# Patient Record
Sex: Female | Born: 1945 | Race: White | Hispanic: No | Marital: Married
Health system: Midwestern US, Academic
[De-identification: ages and names within clinical notes are randomized; demographics above are authoritative.]

---

## 2003-05-16 NOTE — Unmapped (Signed)
Signed by Margaretmary Lombard MD on 05/16/2003 at 00:00:00  Internal Other/COST OF TREATMENT      Imported By: Peggyann Juba RMA 11/03/2003 15:53:48    _____________________________________________________________________    External Attachment:    Please see [employer] EMR for this document.

## 2003-05-16 NOTE — Unmapped (Signed)
Signed by Margaretmary Lombard MD on 05/16/2003 at 00:00:00  Office Visit      Imported By: Peggyann Juba RMA 11/03/2003 15:53:02    _____________________________________________________________________    External Attachment:    Please see [employer] EMR for this document.

## 2003-05-16 NOTE — Unmapped (Signed)
Signed by Baird Kay MD on 05/16/2003 at 00:00:00  Plastic Surgery      Imported By: Coletta Memos 03/19/2005 12:04:53    _____________________________________________________________________    External Attachment:    Please see [employer] EMR for this document.

## 2003-05-16 NOTE — Unmapped (Signed)
Signed by Baird Kay MD on 05/16/2003 at 00:00:00  Treatment Cost for Fraxel Laser      Imported By: Coletta Memos 03/19/2005 12:05:22    _____________________________________________________________________    External Attachment:    Please see [employer] EMR for this document.

## 2003-05-17 NOTE — Unmapped (Signed)
Signed by Margaretmary Lombard MD on 05/17/2003 at 00:00:00  Pathology Report      Imported By: Peggyann Juba RMA 11/03/2003 15:54:44    _____________________________________________________________________    External Attachment:    Please see [employer] EMR for this document.

## 2003-10-03 NOTE — Unmapped (Signed)
Signed by Avon Gully. Fricke on 10/08/2003 at 11:29:07    Phone Note   Call from Patient  Call back at cell [phone]  Caller: Patient  Call For: Mauri Reading  Reason for Call: Talk to Nurse  Summary of Call: Pt is interested in buying Glyquin xm.      Initial call taken by: Burnis Medin,  October 03, 2003 1:21 PM    New Medications:  GLYQUIN XM CREA (HYDROQUINONE-SUNSCREENS CREA) apply bid as directed

## 2003-10-22 NOTE — Unmapped (Signed)
Signed by Avon Gully. Fricke on 10/22/2003 at 15:48:01    Call back at [facility] Phone [phone]    Talked with Nazli's husband. Pharmacy will order Brand Name Glyquin and call whenit comes in.

## 2003-10-22 NOTE — Unmapped (Signed)
Signed by Avon Gully. Fricke on 10/22/2003 at 15:58:01    no visit today, call form completed.

## 2005-03-19 NOTE — Unmapped (Signed)
Signed by Albertine Patricia on 03/19/2005 at 11:51:27    Patient came in today requesting that the lesions be removed the day of her appt scheduled for 1/16. Verified that since is was so [place] ago since Dr. Jens Som saw her in 2005 we would need to have her evaluated again. We then figured out that she was given a quote for the V Beam laser so we reschedule dher to 1/30 with her understanding that she may not be a candidate for the laser again since it was so [place] that she was here. She understands this. She also has a couple of other brown spots that have come up since then as well and again no guarantee these can be removed with the V beam.

## 2006-06-16 NOTE — Unmapped (Signed)
Signed by Avon Gully. Fricke on 06/16/2006 at 19:15:53    Phone Note   Patient Call  Caller's Cell Phone #: [phone]  Caller: patient  Department: Surgery - Plastics  Call for: FRICKE     Summary of Call: NEW PATIENT TO THIS LOCATION - HASN'T BEEN SEEN IN A FEW YEARS --  WOULD FOR YOU TO DO THE TREATMENT YOU USE TO DO TO HER ON HER FIRST VISIT -- WANTED TO DISCUSS W/YOU BEFORE SCHEDULING AN APPT ...  PLEASE CALL CELL     Initial call taken by: Anders Simmonds,  June 16, 2006 9:46 AM              Left msg with with Michol.  I will call her back again on Friday.

## 2006-06-19 NOTE — Unmapped (Signed)
Signed by Avon Gully. Fricke on 06/19/2006 at 13:28:47    Phone Note   Outgoing Call    Summary of call: call Kyah's home and left msg.

## 2006-07-06 NOTE — Unmapped (Signed)
Signed by Avon Gully. Fricke on 07/06/2006 at 09:27:30    Printed Handout:  - Skin Care Home Treatment

## 2006-07-06 NOTE — Unmapped (Signed)
Signed by Baird Kay MD on 07/07/2006 at 13:44:40    Plastic Surgery Skin Care    Skin Evaluation   How do you want to improve your skin? tone, pigmentation  Do you have a history of chronic: sun exposure  Skin Classification: Environmentally Damaged/Hyperpigmented  [name] Sometimes Burn    Contraindications:   Pregnant/lactating: No   Recent facial surgery: No   History of keloid scarring: No   History of autoimmune disease: No   History of herpes simplex: Yes   Use of facial waxing or depilatories: No   Hisotry of Accutane use: No   Oral photosensitizing medications No   Facial warts: No   Recent radiation therapy: No   Allergies: Yes see medical chart  Philosophy of excessive sun exposure: Yes   Photosensitivity: No   Scrubs: Yes   Retinoid use: No   Smoking: No   Visia Complexion Analysis: Yes standard photos taken    Comments: Rebecca Ferguson is interested in having treatments to help reduce browns spots and overall aging.  She is also interested having an upper and lower belph.    Past Treatments:   Glycolic, Laser      Patient Goals for Improvement:   spots, wrinkles, sun damage, texture    Medications   AMITIZA  CAPS (LUBIPROSTONE CAPS)     Allergies  ! ERYTHROMYCIN BASE (ERYTHROMYCIN BASE TABS)      Skin Care Treatment     Area(s) Treated: face, neck  Skin Prepped with: Acetone  Eye Protection Used  Vitalize Peel: 2 layers  0.03% Retinoic Acid  Response: Tolerated well  Manufacturing systems engineer  Reviewed Post Care      Assessment     Plan   Light Acid Treatments #: 1 of 3  Correction  pt has lightening product at home she will use 2 times per day.  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      DISPOSITION:    Return to clinic in 3-4 week(s)         ]

## 2006-07-06 NOTE — Unmapped (Signed)
Signed by Baird Kay MD on 07/06/2006 at 00:00:00  Light Peel and Microdermabrasion      Imported By: Coletta Memos 07/08/2006 14:26:47    _____________________________________________________________________    External Attachment:    Please see [facility] EMR for this document.

## 2006-07-06 NOTE — Unmapped (Signed)
Signed by Avon Gully. Fricke on 07/06/2006 at 09:27:00    Printed Handout:  - Informed Consent for Light Peel and Microdermabrasion

## 2006-07-06 NOTE — Unmapped (Signed)
Signed by Avon Gully. Fricke on 07/06/2006 at 09:27:00    Printed Handout:  Sherlynn Stalls Peel/Microdermabrasion Instructions

## 2006-07-06 NOTE — Unmapped (Signed)
Signed by Dara Hoyer on 07/06/2006 at 14:00:58    [facility] Surgery Scheduling Form    Surgery  / Procedure Schedule Sheet   Requested Date: 07/30/2006  Requested Time: 11 30  Length of Surgery: 3hrs  Form Completed By: Dara Hoyer  Surgeon: Baird Kay MD  Facility: [facility]  Is patient: Out Pt.  Anesthesia Type: Local MAC/Anesthesia    Allergies:   Allergies have not been documented for this patient      Procedure   Procedure: bilateral upper and lower blepharoplasty  Diagnoses: cosmetic      Patient Information   Name: Rebecca Ferguson  DOB: 20-Jan-1946  SSN: [id number]  Address: [address]  [place], [place]  [address]  Gender: Female  Home phone: [phone]  IDX #: [id number]  Last Word #: [id number]  Primary Insurance: ANTHEM [place]  Member ID #: [id number]

## 2006-07-14 NOTE — Unmapped (Signed)
Signed by Dara Hoyer on 07/15/2006 at 18:00:56      Surgery Schedule Change Request Form     Division: Plastic  Surgeon: Baird Kay MD    Reason for Change     Cancel Surgery   Surgery  Date: 07/30/2006  Time: 11 30  Reason For Cancellation: PT CHANGED HER MIND    Notes: Patient Information   Name: Rebecca Ferguson  DOB: 10-29-1945  SSN: [id number]  Address: [address]  [place], [place]  [address]  Gender: Female  Home phone: [phone]  IDX #: [id number]      Completed By: Raynelle Fanning  Date: 07/14/2006

## 2006-07-16 NOTE — Unmapped (Signed)
Signed by Dara Hoyer on 07/16/2006 at 62:95:28    [facility] Surgery Scheduling Form    Surgery  / Procedure Schedule Sheet   Requested Date: 07/20/2006  Requested Time: 4 00  Length of Surgery: 30 MIN  Form Completed By: Dara Hoyer  Surgeon: Baird Kay MD  Facility: [facility] Procedure [facility]    Allergies:   ! ERYTHROMYCIN BASE (ERYTHROMYCIN BASE TABS)          Procedure   Procedure:   EXC LESION FACE  Diagnoses: LESION FACE      Patient Information   Name: Rebecca Ferguson  DOB: [birth year]  SSN: [id number]  Address: [address]  [place], [place]  [address]  Gender: Female  Home phone: [phone]  IDX #: [id number]  Last Word #: [phone]  Primary Insurance: ANTHEM [place]  Member ID #: [id number]

## 2006-07-20 NOTE — Unmapped (Signed)
Signed by Baird Kay MD on 07/20/2006 at 00:00:00    Plastic Photos       Imported By: Dara Hoyer 08/13/2006 13:39:21    _____________________________________________________________________    External Attachments:       1. Type: Image           Comment: External Document     2. Type: Image           Comment: External Document     3. Type: Image           Comment: External Document     4. Type: Image           Comment: External Document     5. Type: Image           Comment: External Document

## 2006-07-20 NOTE — Unmapped (Signed)
Signed by Baird Kay MD on 07/31/2006 at 12:31:13      Pre-Procedure Vital Signs     Medications   AMITIZA  CAPS (LUBIPROSTONE CAPS)     Allergies  ! ERYTHROMYCIN BASE (ERYTHROMYCIN BASE TABS)    Operative Report     Preoperative Diagnosis: lesion left cheek    Postoperative Diagnosis: same    Procedure: excision and simple closure lesion left cheek    Surgeon: Baird Kay MD  Anesthetic: Local 0.5% xylocaine with 1:200000 epinephrine with 4% sodium bicarbonate add in 4:1 ratio    Estimated Blood Loss: Nil    Complications: None apparent    Disposition: Home in good condition    Brief Medical History: lesion suspicious for subcutaneous ca  rec excision  Discussed with patient the risks, benefits and alternatives of the procedure, including scarring    After explanation of the risks, benefits and alternatives, the patient agreed to the procedure.    Details of Procedure:   The patient was taken to the [facility] operating room.  The area was carefully marked, infiltrated with local anesthetic and prepared with Betadine paint.  The lesions were elliptically excised and closed in layers with 6-0 Nylon deep subcutaneous layer The patient tolerated the procedure well and was given instructions for local care.  The patient will return to clinic in 1 week to check the path report and suture wound.        Plan    Orders for today's visit  1. Ordered 11440 - Excision Benign Lesion Face <0.5 cm [CPT-11440]

## 2006-07-20 NOTE — Unmapped (Signed)
Signed by Baird Kay MD on 07/20/2006 at 00:00:00  Operative Consent      Imported By: Heath Gold 07/26/2006 10:56:16    _____________________________________________________________________    External Attachment:    Please see [facility] EMR for this document.

## 2006-07-29 NOTE — Unmapped (Signed)
Signed by Baird Kay MD on 07/31/2006 at 12:31:43    Surgery Nurse Visit    Allergies  ! ERYTHROMYCIN BASE (ERYTHROMYCIN BASE TABS)    Medications   AMITIZA  CAPS (LUBIPROSTONE CAPS)       Intake recorded by: Trellis Moment. 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    Wound/Incision: Clean & dry.    Sutures/Staples: Removed.    Dressing: Other.       Details:  steris applied  Instructions Given: Other.       Details:  steris for 3 days, then moisturizer with an SPF, return PRN

## 2007-01-17 NOTE — Unmapped (Signed)
Signed by Baird Kay MD on 02/01/2007 at 13:46:46    Plastic Surgery Skin Care    Skin Evaluation   Reviewed previous skin evaluation- no changes needed.    Assessment/Comments: Rebecca Ferguson is here after the summer and is concerned with brown spots and the overall look of her skin.    Medications   AMITIZA  CAPS (LUBIPROSTONE CAPS)   HYDROQUINONE 4 % CREA (HYDROQUINONE) Apply every other night for two weeks then every night as tolerated.  TRETINOIN 0.05 % CREA (TRETINOIN) Apply a light application every other night for 2 weeks then every night as tolerated    Allergies  ! ERYTHROMYCIN BASE (ERYTHROMYCIN BASE TABS)      Skin Care Treatment     Area(s) Treated: face, neck  Skin Prepped with: Alcohol  Eye Protection Used     Glycolic Acid: 30%  Minutes: 2   Salicylic Acid: 30 to brown spots%  Minutes: 5  Dermaplaning, CO2    Response: Tolerated well  Manufacturing systems engineer  Reviewed Post Care    Skin Care AM Home Treatment Protocol:   cleanse  HQ   moisturizer   spf    Skin Care PM Home Treatment Protocol:   cleanser  tretinoin  HQ  moisturizer if needed      Plan   Light Acid Treatments #: 4-6 light acid peels every 2-4 weeks  Correction    Medications   New Prescriptions/Refills:  TRETINOIN 0.05 % CREA (TRETINOIN) Apply a light application every other night for 2 weeks then every night as tolerated  #20 gm tube x 0, 02/01/2007, W Thurnell Lose MD  HYDROQUINONE 4 % CREA (HYDROQUINONE) Apply every other night for two weeks then every night as tolerated.  #2 oz x 0, 02/01/2007, W Thurnell Lose MD      DISPOSITION:    Return to clinic in 2-4 week(s)         ]

## 2007-01-27 NOTE — Unmapped (Signed)
Signed by Baird Kay MD on 01/27/2007 at 00:00:00  Micro-Derm Abrasion Consent      Imported By: Heath Gold 02/01/2007 15:27:54    _____________________________________________________________________    External Attachment:    Please see [facility] EMR for this document.

## 2007-03-14 NOTE — Unmapped (Signed)
Signed by Avon Gully. Fricke on 03/14/2007 at 16:10:52    Phone Note   Outgoing Call    Summary of call: left msg on pt's cell to call me to discuss product and to see how pt's skin is doing.

## 2009-02-05 NOTE — Unmapped (Signed)
Signed by Generic  [facility] Provider on 02/05/2009 at 00:00:00  Light Peel and Microdermabrasion      Imported By: Coletta Memos 04/30/2009 11:07:52    _____________________________________________________________________    External Attachment:    Please see Gissela Bloch EMR for this document.

## 2009-02-05 NOTE — Unmapped (Signed)
Signed by Baird Kay MD on 02/05/2009 at 13:45:08    Plastic Surgery Skin Care    Skin Evaluation   How do you want to improve your skin? texture, tone, pigmentation  Do you have a history of chronic: sun exposure  Skin Classification: Environmentally Damaged/Hyperpigmented  [name] Sometimes Burn    Contraindications:   Pregnant/lactating: No   Recent facial surgery: No   History of keloid scarring: No   History of autoimmune disease: No   History of herpes simplex: Yes   Use of facial waxing or depilatories: Yes   Hisotry of Accutane use: No   Oral photosensitizing medications Yes   Facial warts: No   Recent radiation therapy: No   Philosophy of excessive sun exposure: Yes   Photosensitivity: No   Scrubs: Yes buff puff  Retinoid use: No   Smoking: No standard photos taken    Assessment/Comments:   pt would like to refresh her skin and reduce appearance of sundamage, brown spots, refine texture    Medications   AMITIZA  CAPS (LUBIPROSTONE CAPS)   * CALCIUM   * MULTI-VITAMIN   * BABY ASPIRIN   * WATER PILL   * BIO-IDENTICAL HORMONES   REFISSA 0.05 % CREA (TRETINOIN (FACIAL WRINKLES)) apply in the evening every other night for 2 weeks then every night as directed  HYDROQUINONE 4 % CREA (HYDROQUINONE) bid    Allergies  ! ERYTHROMYCIN BASE (ERYTHROMYCIN BASE TABS)  Allergy and adverse reaction list reviewed during this update.    PAST HISTORY  Past Medical History (reviewed - no changes required):  Hormone Replacement Therapy, bio identical hormones  Surgical History (reviewed - no changes required):  Hysterectomy: 1-10, eyelid surgery 2-09        Skin Care Treatment     Area(s) Treated: face, neck  Skin Prepped with: Alcohol  Eye Protection Used  Treatment #: 1  of: 3  Treatment #: 1  of: 3   Vitalize Peel: 2 layers  Dermaplaning    Response: Tolerated well  Manufacturing systems engineer  Reviewed Post Care    Skin Care AM Home Treatment Protocol:   obagi  cleansing  cream  toner  clear  exfoderm  sunfader    Skin Care PM Home Treatment Protocol:   obagi  cleansing cream  toner  clear  blender  refissa      Plan   Light Acid Treatments #: series of 3 light peels every 4 weeks  Correction  pt will start obagi products and return in 4-6 weeks.   pt states she will decide if she wants to continue with light peels.    Medications   New Prescriptions/Refills:  HYDROQUINONE 4 % CREA (HYDROQUINONE) bid  #2 oz x 0, 02/05/2009, Avon Gully. Fricke  REFISSA 0.05 % CREA (TRETINOIN (FACIAL WRINKLES)) apply in the evening every other night for 2 weeks then every night as directed  #20gm x 0, 02/05/2009, Avon Gully. Mauri Reading      Today's Products/Services   Gentle Cleanser [COSMETIC]  Toner  [COSMETIC]  Clear RX 4% HQ [COSMETIC]  Exfoderm [COSMETIC]  Blender RX 4% HQ [COSMETIC]  Sunfader RX 4% HQ [COSMETIC]  Starter Kit Normal/Dry w/4% HQ [COSMETIC]  Other Office Service or Procedure [CPT-99999]    DISPOSITION:    Return to clinic in 4-6 week(s)

## 2009-02-05 NOTE — Unmapped (Signed)
Signed by Baird Kay MD on 02/05/2009 at 00:00:00    Plastic Photos      Imported By: Kandace Parkins 02/26/2010 11:03:58    _____________________________________________________________________    External Attachments:       1. Type: Image           Comment: External Document     2. Type: Image           Comment: External Document     3. Type: Image           Comment: External Document     4. Type: Image           Comment: External Document

## 2009-02-05 NOTE — Unmapped (Signed)
Signed by Avon Gully. Fricke on 02/05/2009 at 10:04:29    Printed Handout:  - CONSENT - LIGHT PEELS AND MICRODERMABRASION

## 2009-02-05 NOTE — Unmapped (Signed)
Signed by Avon Gully. Fricke on 02/05/2009 at 10:04:59    Printed Handout:  - POST-TREATMENT INSTRUCTIONS: LIGHT PEEL/MICRODERMABRASION

## 2011-01-14 ENCOUNTER — Ambulatory Visit: Admit: 2011-01-14 | Discharge: 2011-01-14

## 2011-01-14 DIAGNOSIS — Z411 Encounter for cosmetic surgery: Secondary | ICD-10-CM

## 2011-01-14 NOTE — Unmapped (Signed)
Skin Evaluation     How do you want to improve your skin? texture, tone, pigmentation, brown spot   Do you have a history of chronic: sun exposure   Skin Classification: Environmentally Damaged/Hyperpigmented   [name]      Contraindications:   Pregnant/lactating: No   Recent facial surgery: No   History of keloid scarring: No   History of autoimmune disease: No   History of herpes simplex: Yes usually caused by sun  Use of facial waxing or depilatories: Yes, every 3-4 weeks   Hisotry of Accutane use: No   Oral photosensitizing medications Yes   Facial warts: No   Recent radiation therapy: No   Philosophy of excessive sun exposure: Yes   Photosensitivity: No   Scrubs: Yes buff puff   Retinoid use: No   Smoking: no     Comments:  Last seen for skin care on 02/05/09  Presently using otc products and sunscreen spf 50 daily   Patient would like to refresh her skin and reduce appearance of sundamage, brown spots, refine texture.  She is interested in starting a skin care program again and would like information and pricing.  She would like to look over the information from today's visit and discuss it with her husband.  She will return for a physician skin care consultation if she decides to start the program.  Reviewed the skin care program, light acid peels and the Obagi system.      Photos:01/14/11

## 2011-01-17 NOTE — Unmapped (Signed)
Reviewed light acid peel / microdermabrasion instructions, pre and post  treatment instructions given to patient.

## 2011-02-11 ENCOUNTER — Ambulatory Visit: Admit: 2011-02-11 | Discharge: 2011-02-11

## 2011-02-11 DIAGNOSIS — Z411 Encounter for cosmetic surgery: Secondary | ICD-10-CM

## 2011-02-11 NOTE — Unmapped (Signed)
Physician Skin Care Visit    Skin Evaluation     How do you want to improve your skin? texture, tone, pigmentation, brown spot   Do you have a history of chronic: sun exposure   Skin Classification: Environmentally Damaged/Hyperpigmented   [name]      Contraindications:   Pregnant/lactating: No   Recent facial surgery: No   History of keloid scarring: No   History of autoimmune disease: No   History of herpes simplex: Yes, usually caused by sun  Use of facial waxing or depilatories: Yes, every 3-4 weeks   Hisotry of Accutane use: No   Oral photosensitizing medications Yes   Facial warts: No   Recent radiation therapy: No   Philosophy of excessive sun exposure: Yes   Photosensitivity: No   Scrubs: Yes, buff puff   Retinoid use: No   Smoking: no     Comments:  Last seen for skin care on 02/05/09  Presently using otc products and sunscreen spf 50 daily   Interested in starting the [employer] system.  She would like  to refresh her skin and reduce appearance of sundamage, brown spots, fine lines and uneven  texture.    Reviewed the skin care program, light acid peels and the Obagi system.      Physician Visit: 02/11/11 with Dr. Cyndie Chime  Plan: Start the [facility] System, tretinoin and return in 4-6 weeks.  Series of  3 light acid peels every 4 weeks.  Reviewed light acid peels, the Obagi system tretinoin, hydroquinone and the daily use of sunscreen. Stop using the buff puff.    Photos:01/14/11

## 2011-02-11 NOTE — Unmapped (Signed)
Reviewed home treatment protocol.     AM Morning Home Protocol  Obagi   Gentle Cleanser   Toner  Clear  Exfoderm  Sunfader    PM Evening Home Protocol  Obagi   Gentle Cleanser   Toner  Clear  Blender  Tretinoin  -every other for 2 weeks then every night as tolerated       Purchased Products: Obagi Nuderm System; Gentle Cleanser, Toner, Clear, Exfoderm, Blender, Sunfader.  Tretinoin 0.1%

## 2011-02-17 MED ORDER — hydroquinone (MELQUIN-HP) 4 % cream
4 | Freq: Two times a day (BID) | TOPICAL | 0.00 refills | 29.00000 days | Status: AC
Start: 2011-02-17 — End: 2017-02-22

## 2011-02-17 MED ORDER — tretinoin (RETIN-A) 0.1 % cream
0.1 | TOPICAL | Status: AC
Start: 2011-02-17 — End: 2017-02-22

## 2011-02-17 NOTE — Unmapped (Signed)
I have reviewed the notes, assessments, and/or procedures performed by [facility] with the patient and  I concur with her documentation of Rebecca Ferguson.

## 2011-04-13 ENCOUNTER — Institutional Professional Consult (permissible substitution): Admit: 2011-04-13 | Discharge: 2011-04-13

## 2011-04-13 DIAGNOSIS — Z411 Encounter for cosmetic surgery: Secondary | ICD-10-CM

## 2011-04-13 NOTE — Unmapped (Signed)
Comments:  Patient is using Obagi Nuderm system including tretinoin 3 x per week.  No peeling or dryness. Redness on cheeks and nose.  Stopped tretinoin 1 week ago but used a buff puff on her skin 2 days ago.  No treatment today.  Suggested a physician consultation  to discuss options for treating telangiectasia.  Reviewed pre treatment instructions for light peels including stopping all exfoliants 5-7 days  prior to treatment including scrubs and buff puffs.

## 2011-04-13 NOTE — Unmapped (Addendum)
Reviewed home treatment protocol.     AM Morning Home Protocol  Obagi   Gentle Cleanser   Toner  Clear  Exfoderm  Sunfader    PM Evening Home Protocol  Obagi   Gentle Cleanser   Toner  Clear  Blender  Tretinoin  -every other night, cheeks and nose 2-3 x per week

## 2011-05-15 NOTE — Unmapped (Signed)
Patient called and would like to know which products to use on vacation and when to have a light peel.  Reviewed protocol.  AM Gentle Cleanser, Toner, Moisturizer and Sunscreen spf 30 or higher. Reapply sunscreen every 2 hours when outdoors.  PM Gentle Cleanser, Toner, Blender, Moisturizer.  Stop tretinoin during vacation and 1 week prior to vacation.  Patient states she has a Neutrogena moisturizer/sunscreen spf50.    Patient will wait until she is back for her trip to schedule a light peel.  Schedule at least 2 weeks after returning from vacation and stop tretinoin 1 week prior to treatment.

## 2011-08-07 ENCOUNTER — Institutional Professional Consult (permissible substitution): Admit: 2011-08-07 | Discharge: 2011-08-07

## 2011-08-07 DIAGNOSIS — Z411 Encounter for cosmetic surgery: Secondary | ICD-10-CM

## 2011-08-07 NOTE — Unmapped (Signed)
LIght Acid Peel       Treatment # 1 of  6    Area Treated:  Face,neck    Cleansed   Prep with Alcohol   Dermaplaning   Eye Protection  Applied 30% Glycolic Acid  for 3 minutes  Applied 20 %  Salicylic to three brown spots on cheeks  Applied Moisturizer  Applied Sunscreen    Comments:      Physician Visit: 02/11/11 with Dr. Cyndie Chime   Plan: Start the [facility] System, tretinoin and return in 4-6 weeks.   Series of 3 light acid peels every 4 weeks.   Reviewed light acid peels, the Obagi system tretinoin, hydroquinone and the daily use of sunscreen. Stop using the buff puff.      Consent Signed: 08/07/11  Photos: 08/07/11

## 2011-08-07 NOTE — Unmapped (Addendum)
Reviewed home treatment protocol.     AM Morning Home Protocol  Obagi   Gentle Cleanser   Toner  Clear  Exfoderm  Sunfader      Suggested Vit C Serum 15%    PM Evening Home Protocol  Obagi   Gentle Cleanser   Toner  Clear  Blender  Tretinoin  -every other night, cheeks and nose 2-3 x per week

## 2017-02-22 ENCOUNTER — Ambulatory Visit: Admit: 2017-02-22 | Discharge: 2017-02-22 | Payer: MEDICARE

## 2017-02-22 DIAGNOSIS — M65332 Trigger finger, left middle finger: Secondary | ICD-10-CM

## 2017-02-22 NOTE — Unmapped (Signed)
This office note has been dictated.    This note has been dictated.    I saw and evaluated the patient, and discussed with the resident. I agree with the resident  findings and plan as documented in the office note.    [place] Surges J Eppie Barhorst, M.D.

## 2017-02-22 NOTE — Unmapped (Signed)
Rebecca Ferguson  02/22/2017     CHIEF COMPLAINT     Chief Complaint   Patient presents with   ??? New Patient Visit/ Consultation     left 3rd digit        HISTORY OF PRESENT ILLNESS     Rebecca Ferguson is a right hand dominant 71 y.o. female who presents with triggering of the left middle finger.  Pain is also present to the volar base of the finger in the region of the A1 pulley.  Pressure to this area exacerbates the patient's symptoms.  This has been present for 6 months and has been following a daily course during that time.  Associated symptoms include finger swelling.  No previous treatments.    Likes to work out, [employer], and walk.  Cleans house  Spinal fusion 3 weeks ago  Wakes up to stiff finger  Her mother had surgery for trigger finger  No DM, RA, or thyroid problems     MEDICAL HISTORY   Allergies:  Allergies   Allergen Reactions   ??? Erythromycin Base        Past Medical History:  Past Medical History:   Diagnosis Date   ??? Health education/counseling     Hormone replacement therapy- Bio-Identical hormones       Current Medications:  @ACTMED @    Past Surgical History:   Past Surgical History:   Procedure Laterality Date   ??? EYELID SURGERY  04/2007   ??? HYSTERECTOMY  03/2008       Social History:   Social History     Social History   ??? Marital status: Married     Spouse name: N/A   ??? Number of children: N/A   ??? Years of education: N/A     Social History Main Topics   ??? Smoking status: Former Smoker   ??? Smokeless tobacco: Not on file   ??? Alcohol use 4.2 oz/week     7 Glasses of wine per week   ??? Drug use: Unknown   ??? Sexual activity: Not on file     Other Topics Concern   ??? Caffeine Use No   ??? Exercise Yes     Social History Narrative   ??? No narrative on file       Family History:  Family History   Problem Relation Age of Onset   ??? Cancer Mother         melanoma        REVIEW OF SYSTEMS     Constitutional:  Denies fever, chills or weight loss   Eyes:  Denies photophobia or discharge   HEENT:  Denies sore throat  or acute eye problems   Respiratory:  Denies cough or shortness of breath   Cardiovascular:  Denies chest pain or palpitations  GI:  Denies abdominal pain, nausea, vomiting, or diarrhea   Skin:  Denies rash   Neurologic:  Denies headache, focal weakness or sensory changes   Endocrine:  Denies polyuria or polydypsia   Lymphatic:  Denies swollen glands   Musculoskeletal:  As described in HPI     PHYSICAL EXAMINATION     General: Well-nourished, no acute distress, normal affect, conversant and oriented    Upper Extremity Overview:   Normal alignment of the elbows, wrists and digits.  No visible or palpable tenosynovitis of the extensor tendons, the wrist or the digital articulations.    Skin:  Normal appearance and texture, consistent with age.  No significant  lesions or abnormalities.    Vascular: All fingertips are pink with good capillary refill and of normal temperature.  No edema or atrophy.  No areas of ischemia or ulcers.    Range of Motion:  Elbows: Normal carrying angle and a full arc of motion into flexion and extension, supination and pronation.  Wrists: Full and symmetric arc of motion.    All fingers demonstrated a full, free and uninhibited range of motion    Palpatory:  There was focal tenderness over the A1 pulley of the left middle finger.  A palpable nodule was felt within the flexor tendons in the region of the A1 pulley as they patient was brought through flexion and extension.  Pressure over this region with movement exacerbated symptoms.  No additional areas of pain or discomfort identified    Sensory Exam:  Light touch was intact in both forearms and hands.     Sensory Provocative Tests:    Phalen's and Tinel's were negative over the median nerves at the wrist and the ulnar nerves at the elbow.    Motor Exam:  There was no extrinsic or intrinsic muscular atrophy.  All musculoskeletal units were functioning independently at a 5/5 strength.    Joint Stability: No obvious dislocation, subluxation or  significant laxity in either upper extremity.    Other Provocative Tests and Abnormal Findings:  None      IMAGING     none     IMPRESSIONS   Rebecca Ferguson is a 71 y.o. female who presents with the following diagnoses:  1. Left Stucki finger trigger finger    Interventions:  1. None today    PLAN     We discussed the causes, anatomy and different treatment options for trigger finger.  Patient education materials have been reviewed.  We have discussed the options of monitoring, cortisone injection and surgical release along with associated risks and benefits. Based on our lengthy discussion the patient has elected to proceed with monitoring.  We will see her back as needed for reevaluation.  All questions were answered in the office today.      Harrell Niehoff Crist Infante, MD

## 2019-12-25 IMAGING — MG MAMMOGRAPHY SCREENING BILATERAL 3D TOMOSYNTHESIS WITH CAD
8 series · 8 of 24 positions shown · non-contrast
Comparison: Comparison was made to prior exams. 
BREAST DENSITY: (Level B) There are scattered areas of fibroglandular density.

MAMMOGRAPHY SCREENING BILATERAL 3D TOMOSYNTHESIS WITH CAD, 12/25/2019 [DATE]: 
CLINICAL INDICATION: Screening exam.
TECHNIQUE: Digital bilateral mammograms and 3-D Tomosynthesis were obtained. 
These were interpreted both primarily and with the aid of computer-aided 
detection system.

[R MLO]
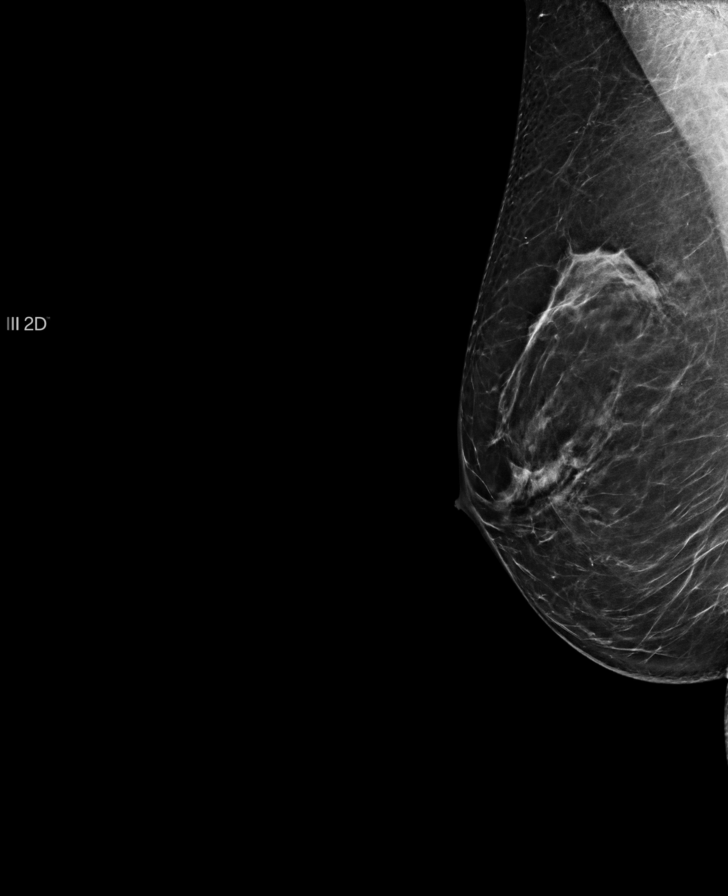

[R CC]
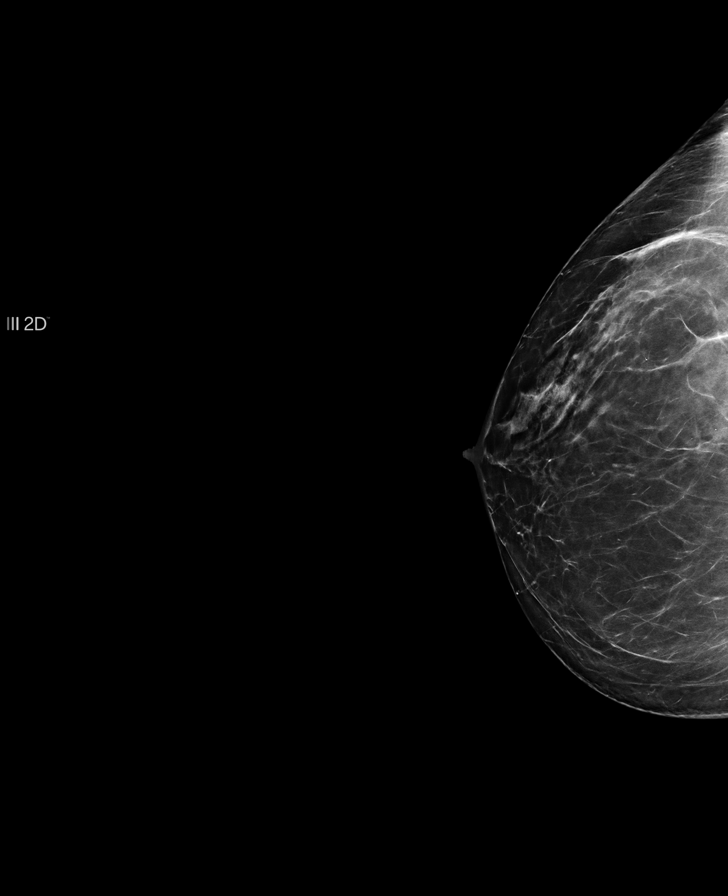

[L MLO]
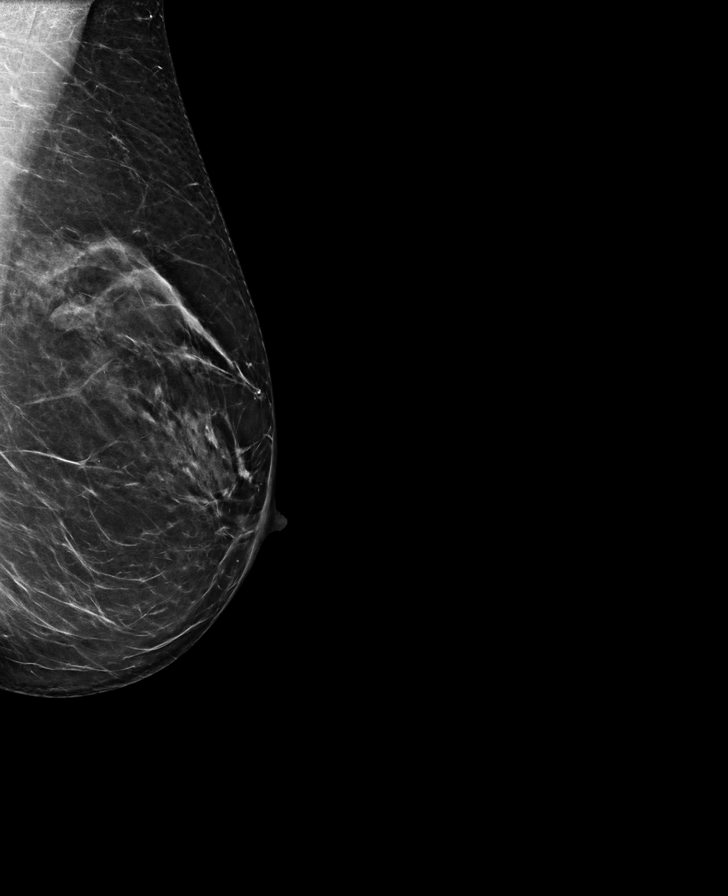

[L CC]
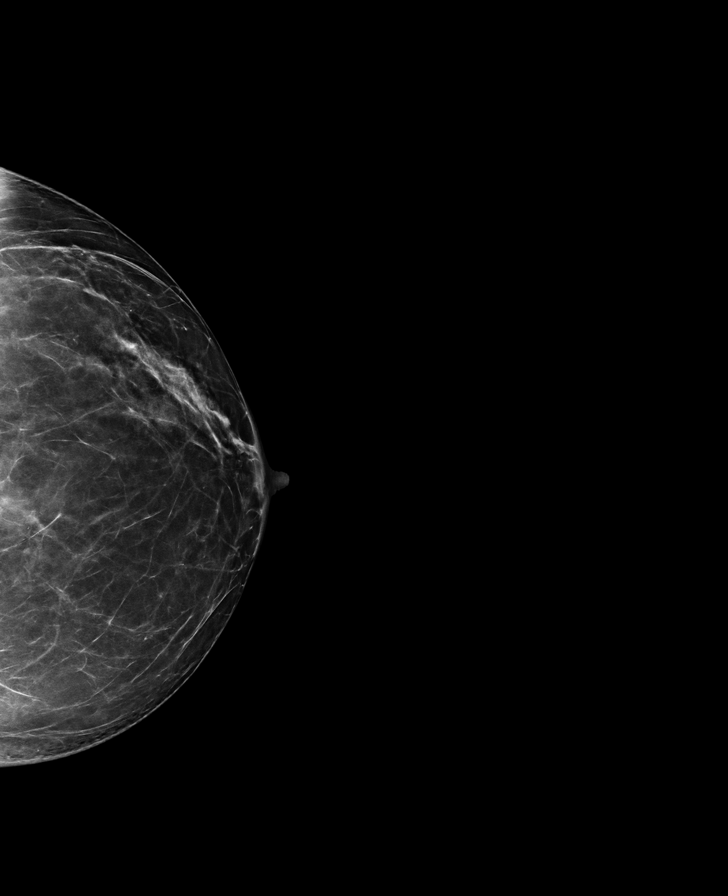

[R MLO tomo · tomo slice 37/74.0]
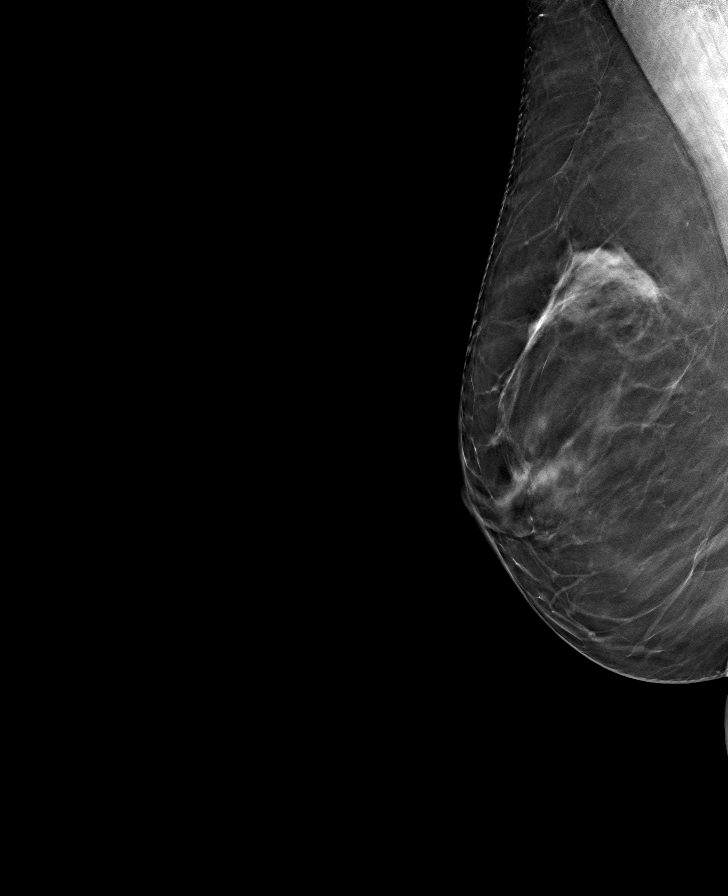

[L CC tomo · tomo slice 35/68.0]
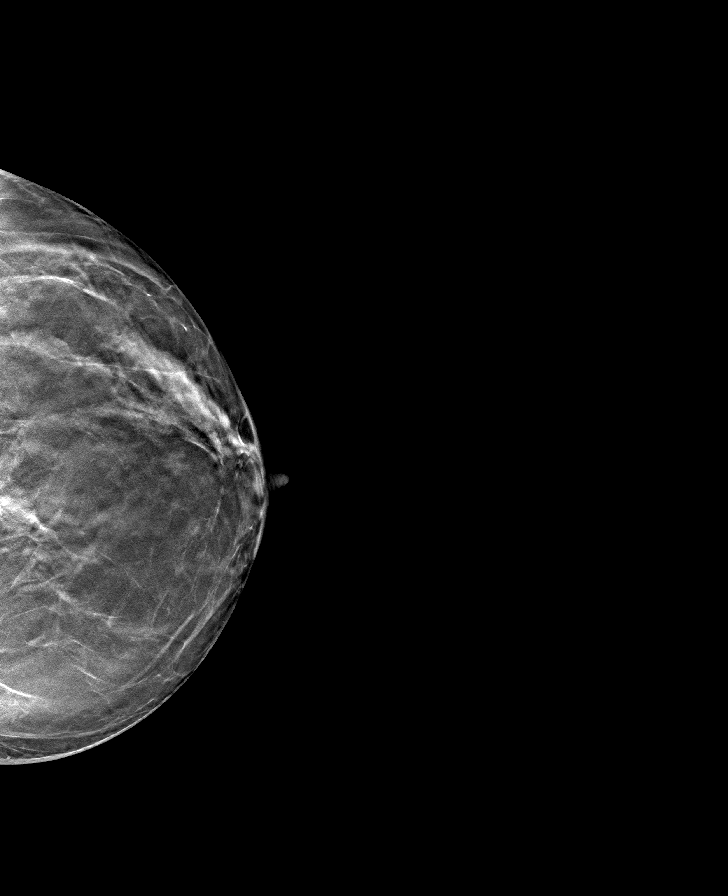

[L MLO tomo · tomo slice 37/73.0]
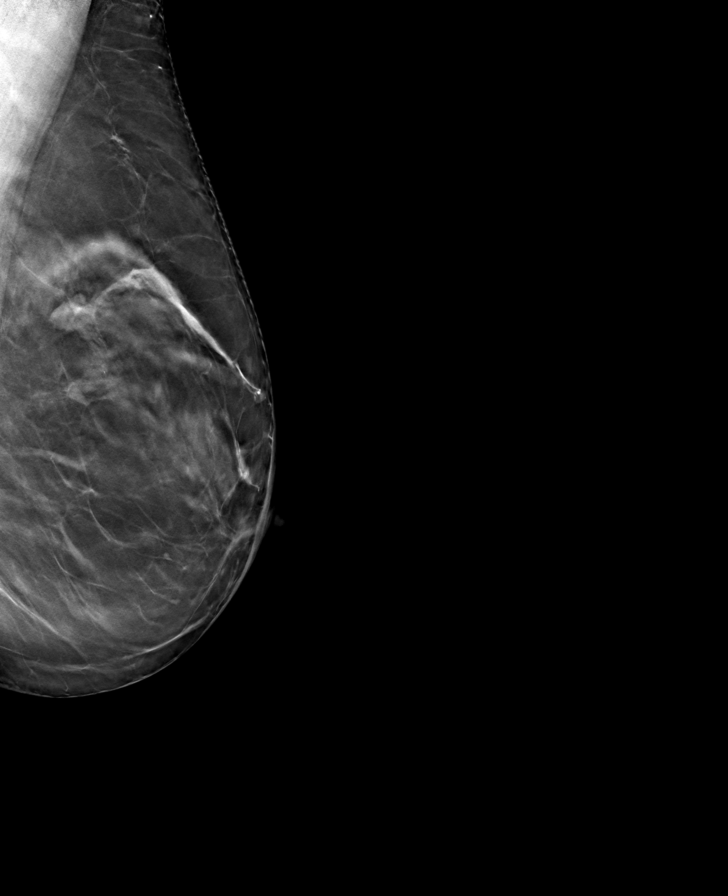

[R CC tomo · tomo slice 37/73.0]
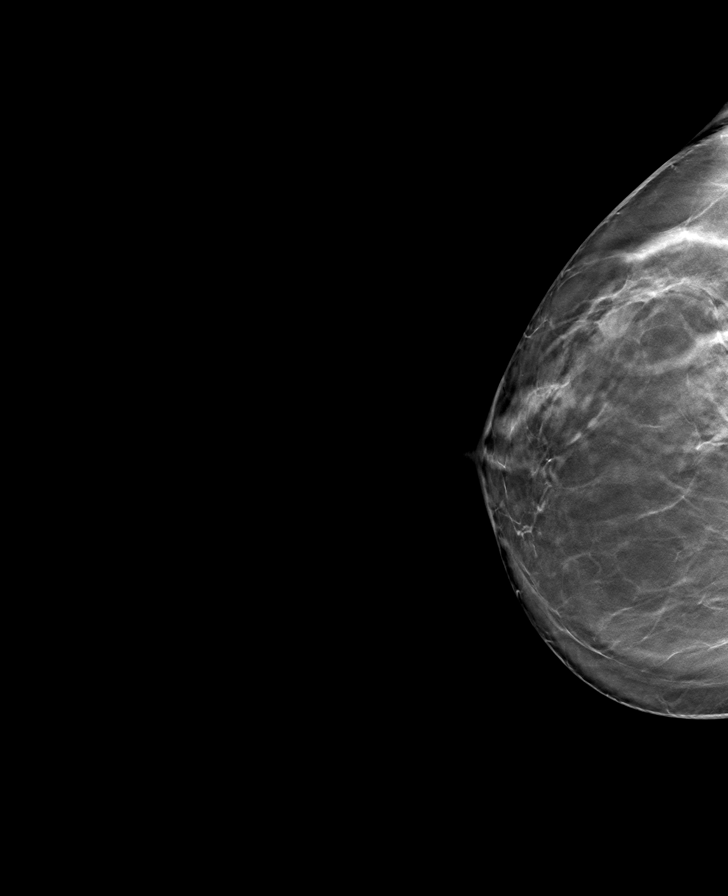

[8 of 24 positions shown; findings below may reference images not displayed]

FINDINGS: No suspicious mass, calcifications, or area of architectural 
distortion in either breast.
IMPRESSION: No mammographic evidence of malignancy in either breast. 
( BI-RADS 1) Negative mammogram. Routine mammographic follow-up is recommended.

## 2021-01-03 IMAGING — MG MAMMOGRAPHY SCREENING BILATERAL 3[PERSON_NAME]
8 series · 8 of 24 positions shown · non-contrast
Comparison: 12/26/2019 and dating back to 11/07/2015.

________________________________________________________________________________________________ 
MAMMOGRAPHY SCREENING BILATERAL 3BLAIN JUMPER, 01/03/2021 [DATE]: 
CLINICAL INDICATION: Screening.
TECHNIQUE: Digital bilateral mammograms and 3-D Tomosynthesis were obtained. 
These were interpreted both primarily and with the aid of computer-aided 
detection system.  
BREAST DENSITY: (Level B) There are scattered areas of fibroglandular density.

[L CC]
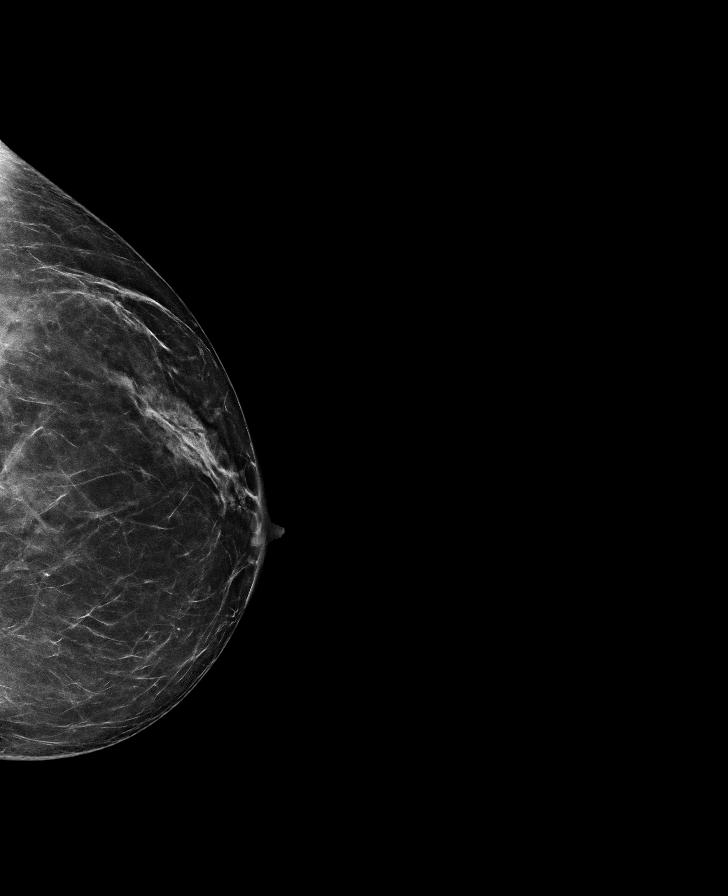

[R CC]
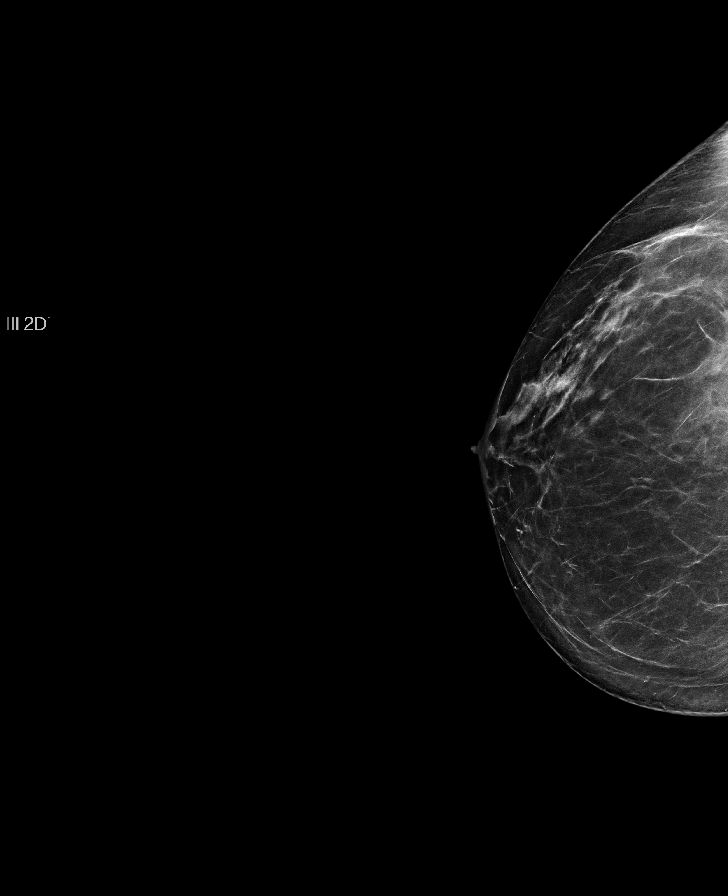

[R MLO]
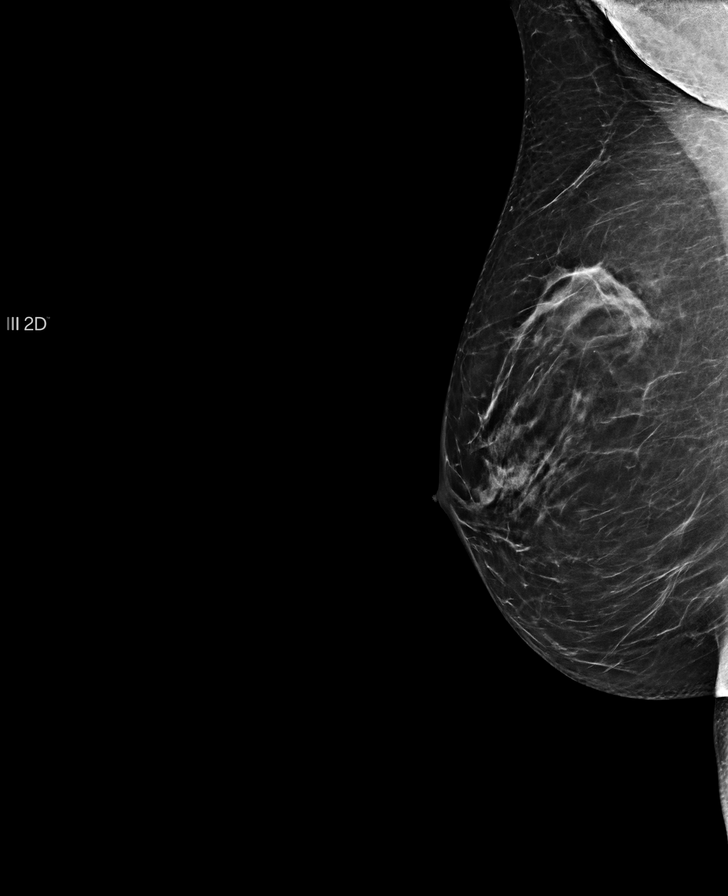

[L MLO]
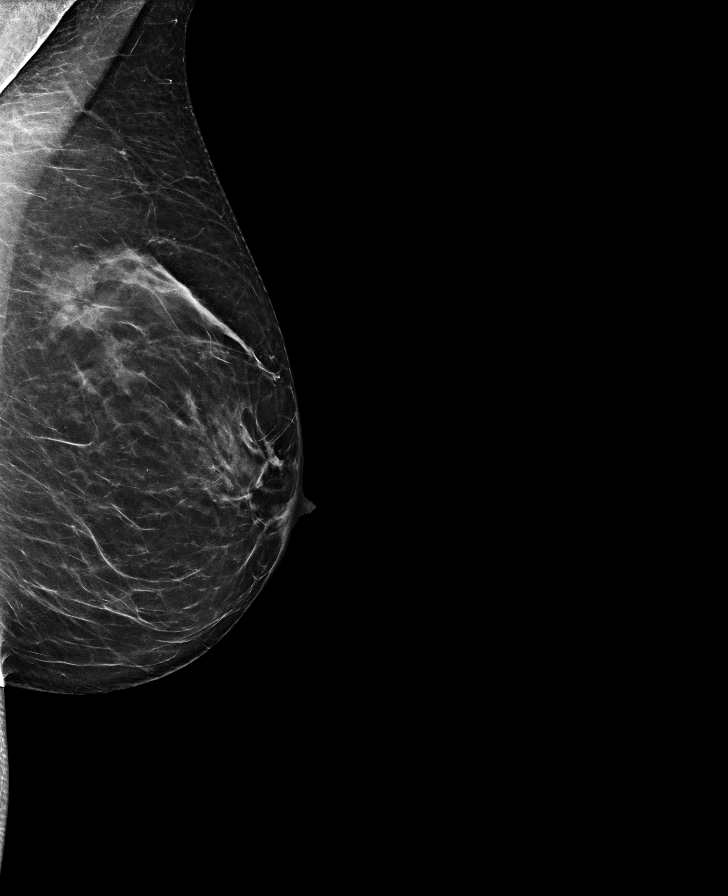

[L CC tomo · tomo slice 35/69.0]
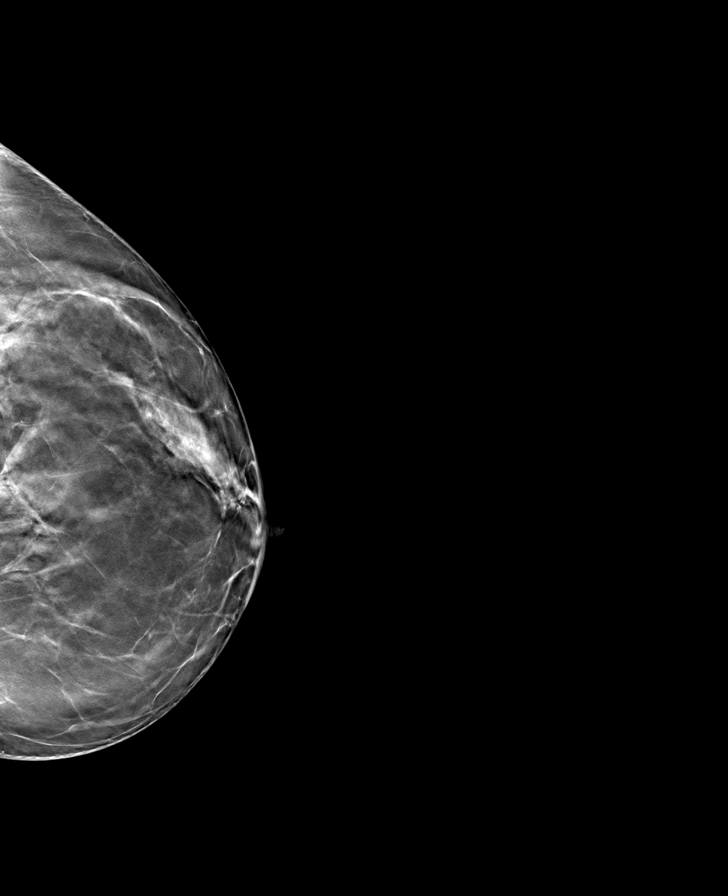

[R CC tomo · tomo slice 34/67.0]
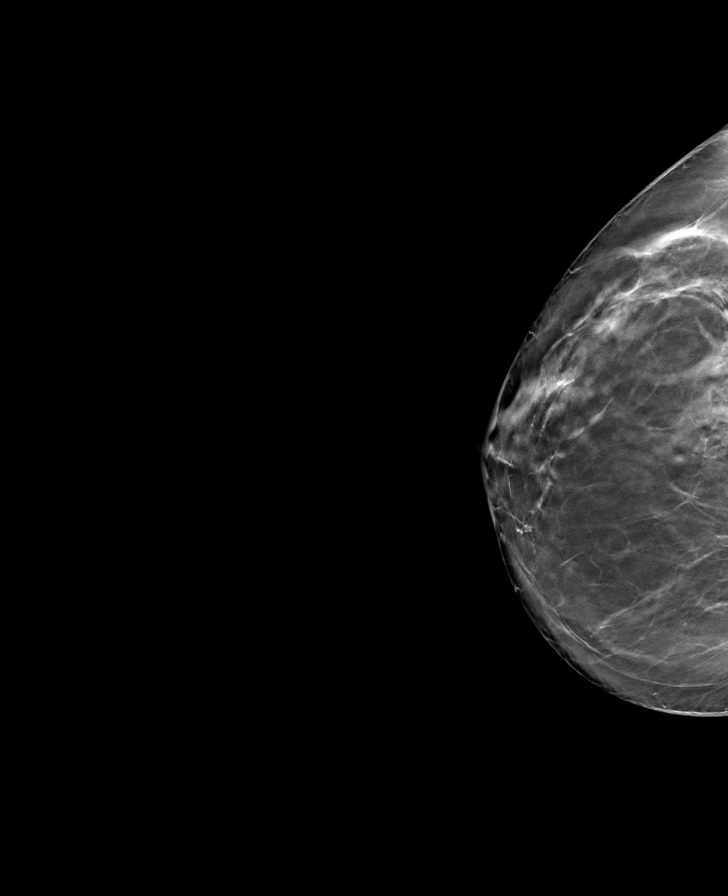

[R MLO tomo · tomo slice 35/69.0]
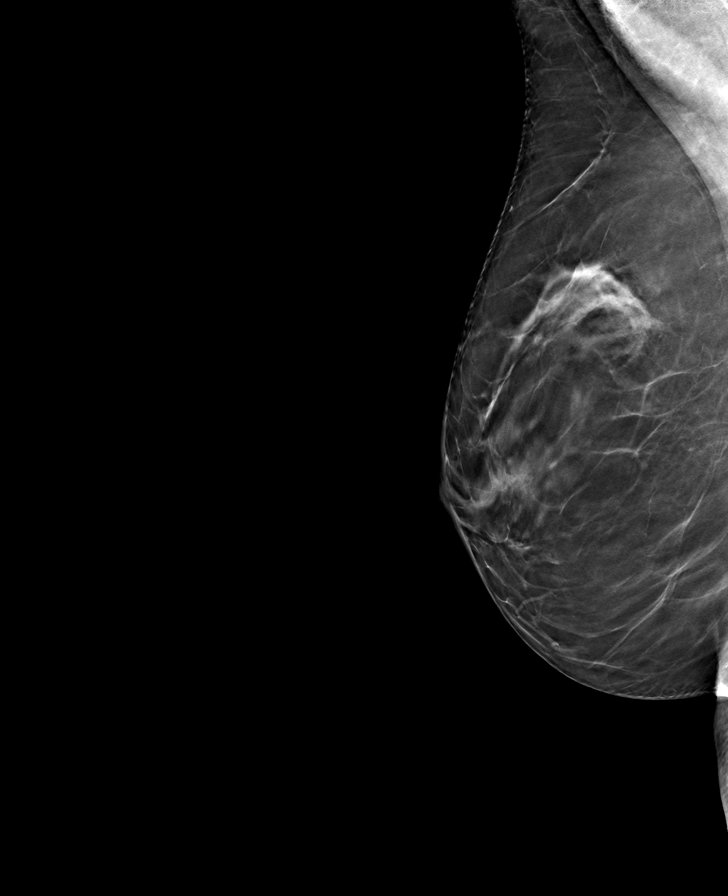

[L MLO tomo · tomo slice 35/69.0]
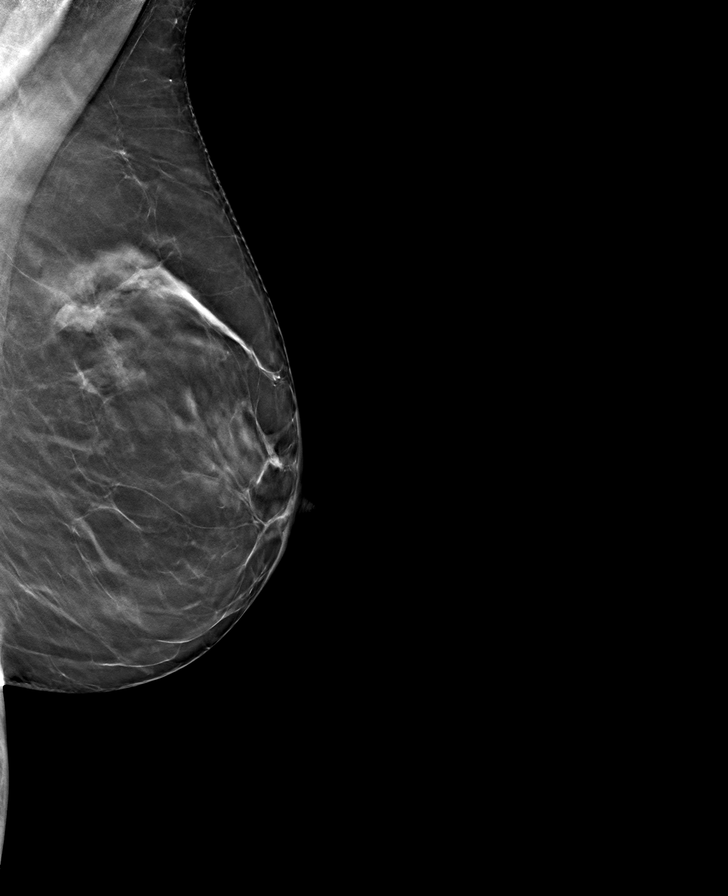

[8 of 24 positions shown; findings below may reference images not displayed]

FINDINGS: Overall stable mammographic appearance. No suspicious mass, 
calcifications, or area of architectural distortion in either breast.
IMPRESSION: No mammographic findings suggestive for malignancy. 
(BI-RADS 2) Benign findings. Routine mammographic follow-up is recommended.

## 2022-01-20 IMAGING — MG MAMMOGRAPHY SCREENING BILATERAL 3[PERSON_NAME]
8 series · 8 of 24 positions shown · non-contrast
Comparison: Comparison was made to prior examinations.

________________________________________________________________________________________________ 
MAMMOGRAPHY SCREENING BILATERAL 3HYKMETE ISLEYEN, 01/20/2022 [DATE]: 
CLINICAL INDICATION: Encounter for screening mammogram.
TECHNIQUE: Digital bilateral mammograms and 3-D Tomosynthesis were obtained. 
These were interpreted both primarily and with the aid of computer-aided 
detection system.  
BREAST DENSITY: (Level C) The breasts are heterogeneously dense, which may 
obscure small masses.

[R CC]
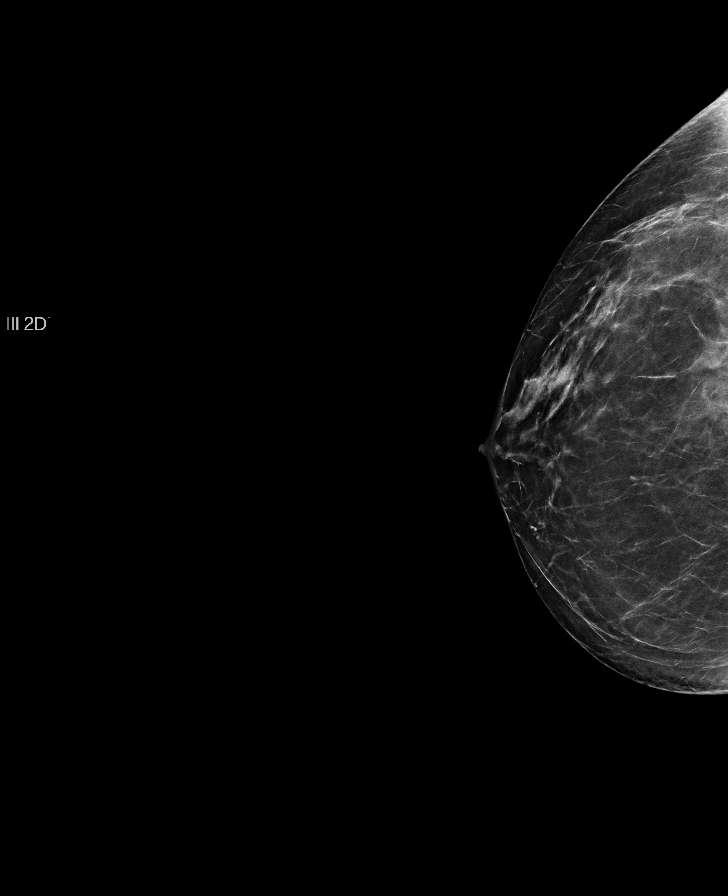

[L CC]
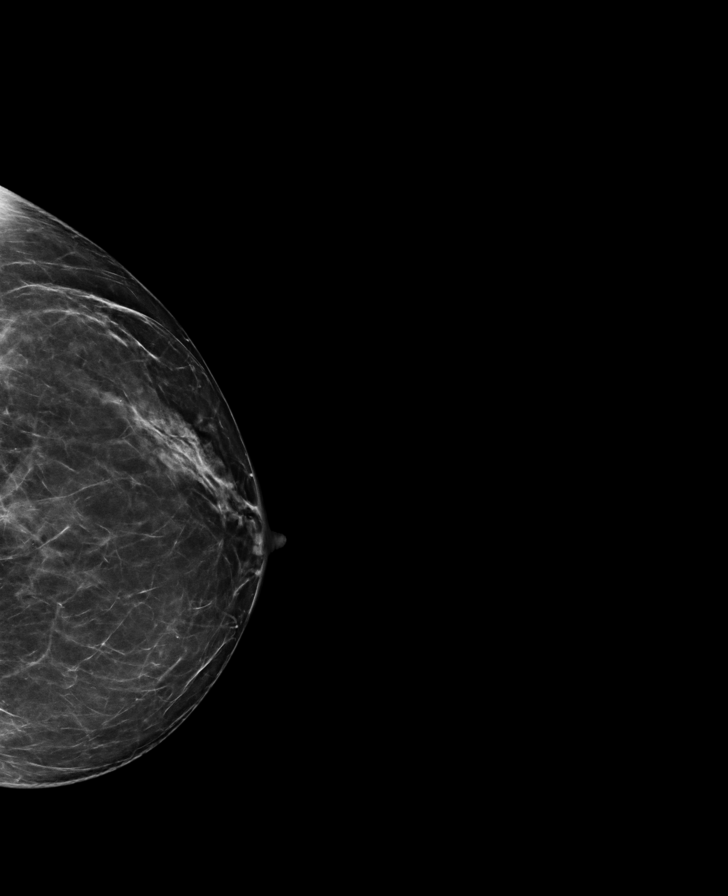

[L MLO]
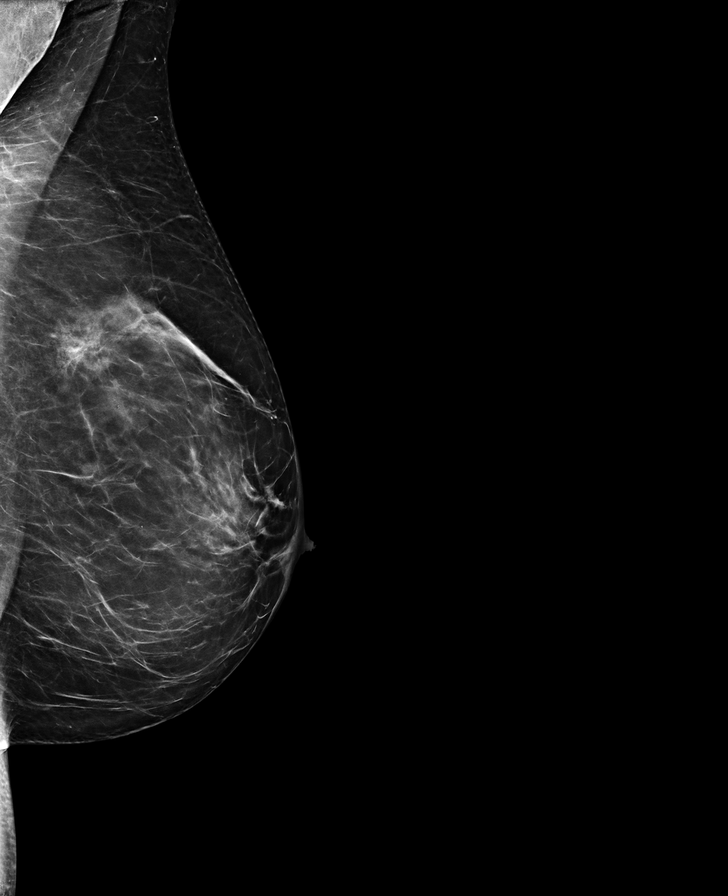

[R MLO]
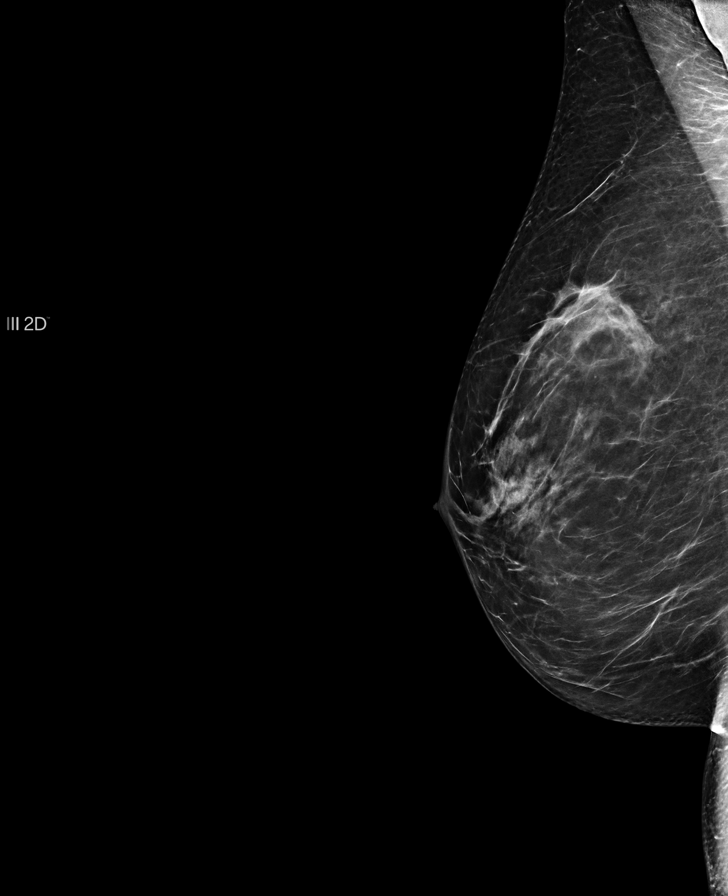

[L CC tomo · tomo slice 34/67.0]
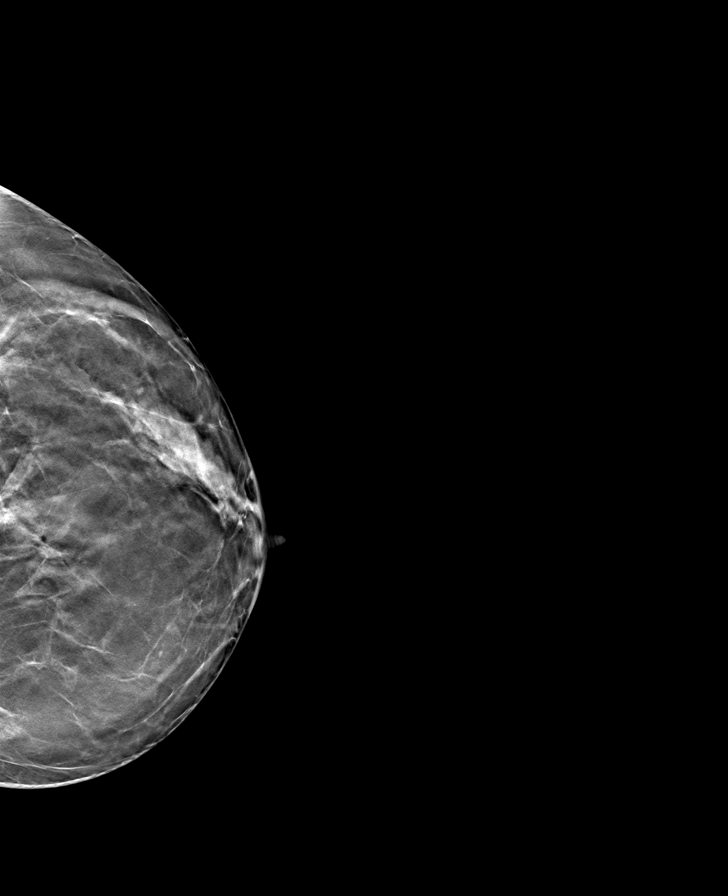

[R CC tomo · tomo slice 35/68.0]
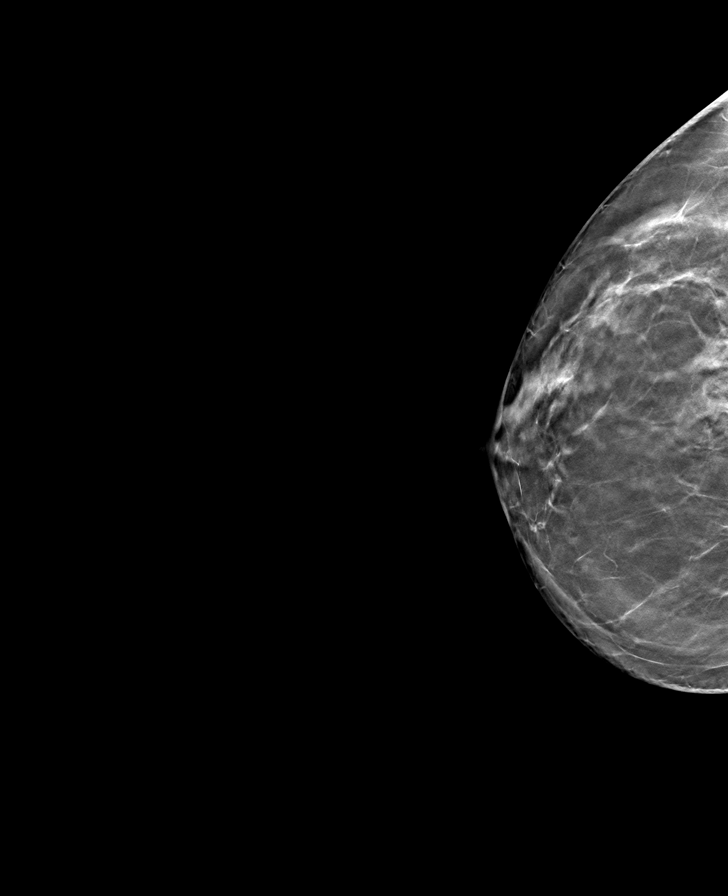

[R MLO tomo · tomo slice 35/69.0]
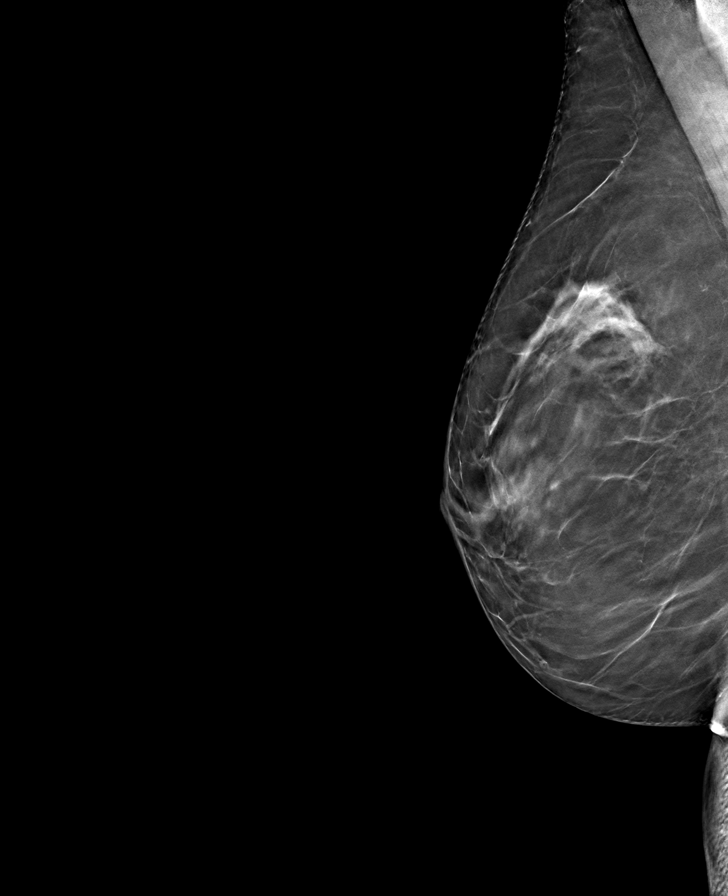

[L MLO tomo · tomo slice 36/71.0]
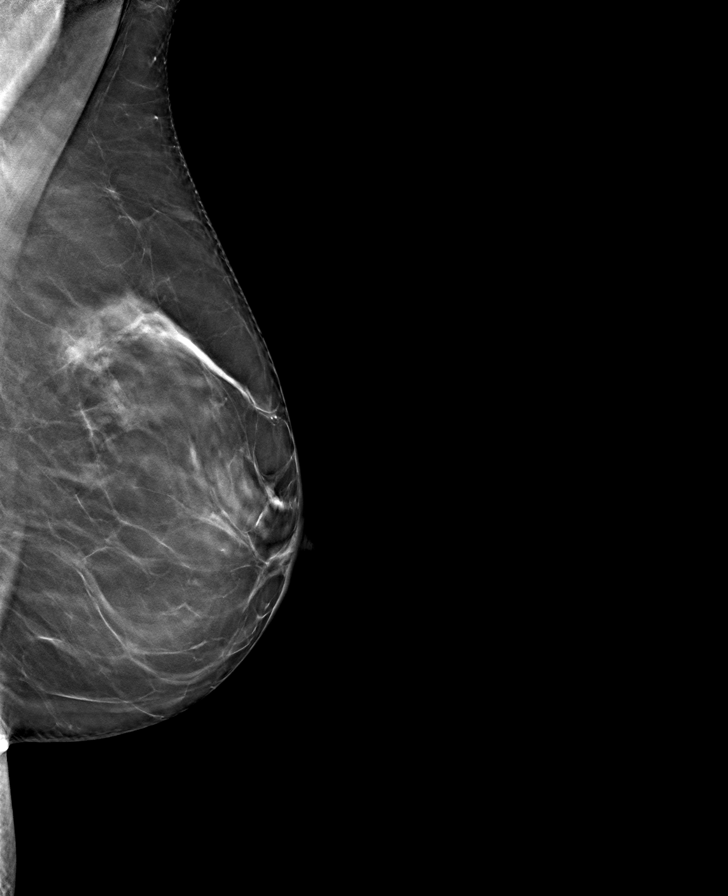

[8 of 24 positions shown; findings below may reference images not displayed]

FINDINGS: Benign calcification. No suspicious mass, calcifications, or area of 
architectural distortion in either breast.
IMPRESSION: Stable mammogram. 
(BI-RADS 2) Benign findings. Routine mammographic follow-up is recommended.

## 2022-07-09 IMAGING — CT CT CALCIUM SCORING
1 series · 15 of 20 positions shown, 19 images · non-contrast
Comparison: There are no previous exams available for comparison.

________________________________________________________________________________________________ 
CT CALCIUM SCORING, 07/09/2022 [DATE]:
INDICATION: Hyperlipidemia, Unspecified

[Series 2: cascoreseq 3.0 b35s 60% · axial · 0.41mm/px · z∈[-222,-101]mm · 15 of 91 slices shown, 19 images]
[im 5/91  vessel]
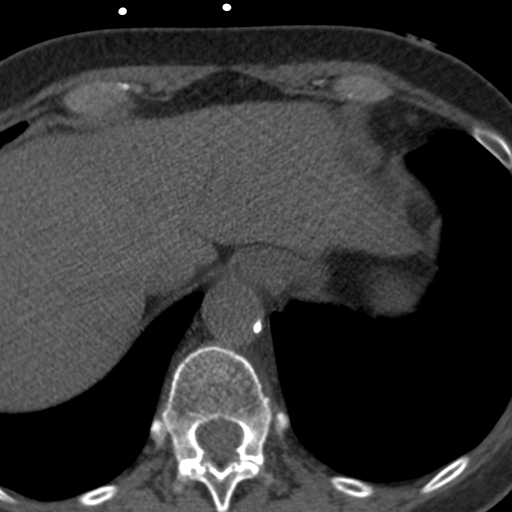
[im 5/91  lung]
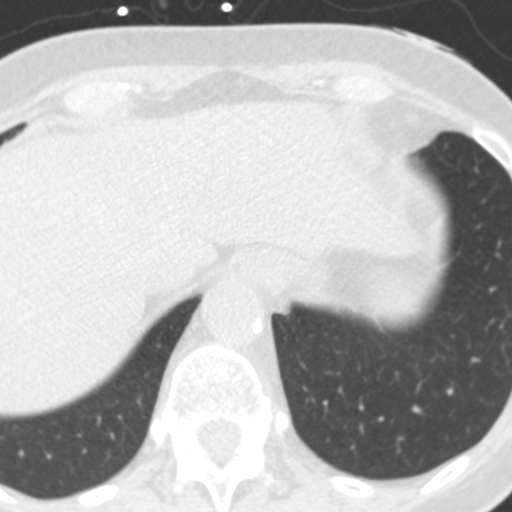
[im 10/91  vessel]
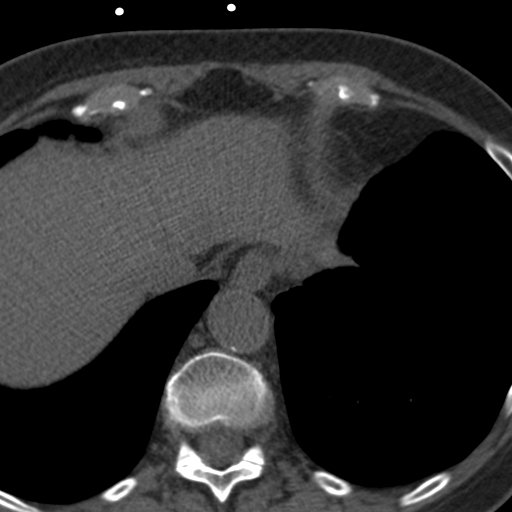
[im 19/91  vessel]
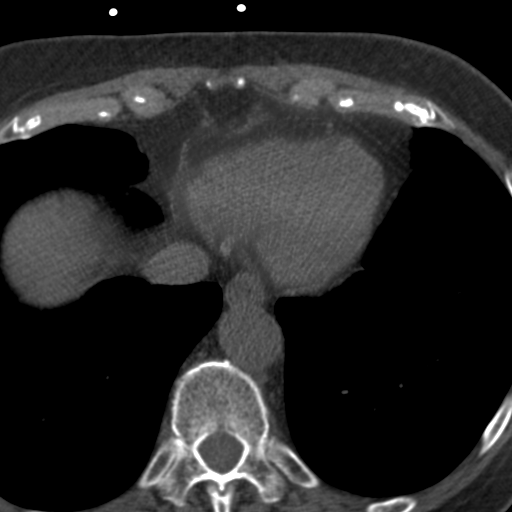
[im 24/91  vessel]
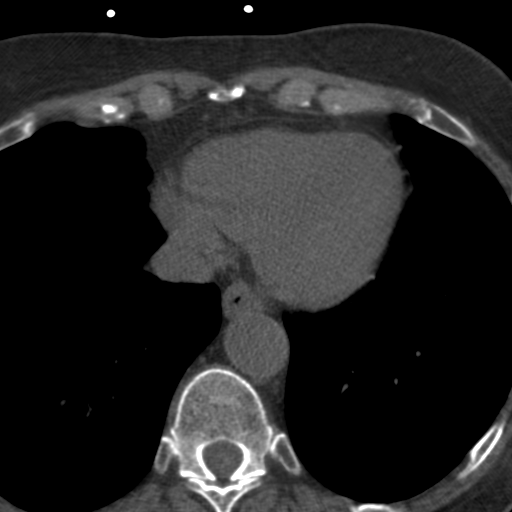
[im 29/91  vessel]
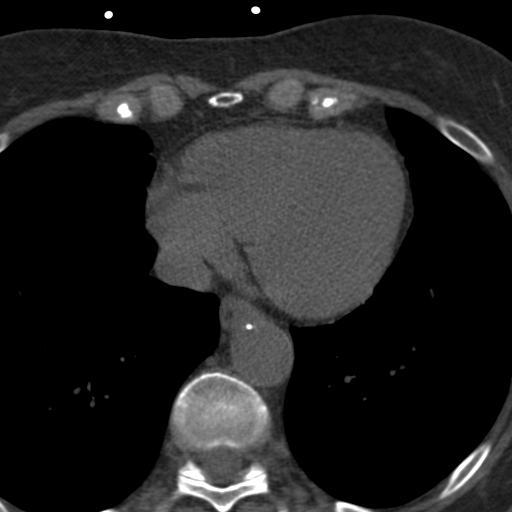
[im 29/91  lung]
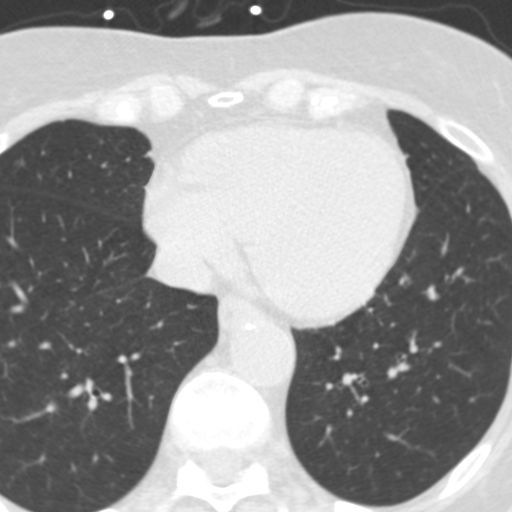
[im 34/91  vessel]
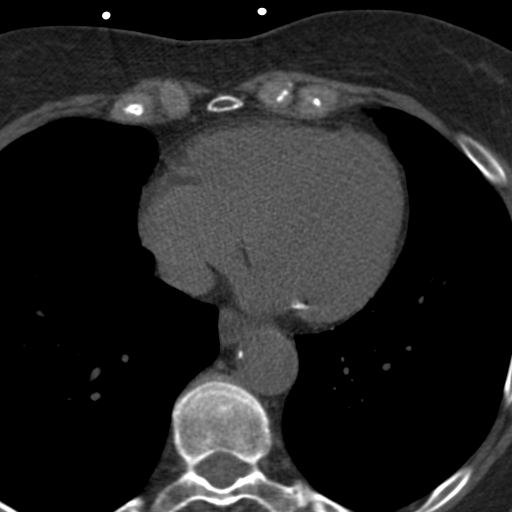
[im 38/91  vessel]
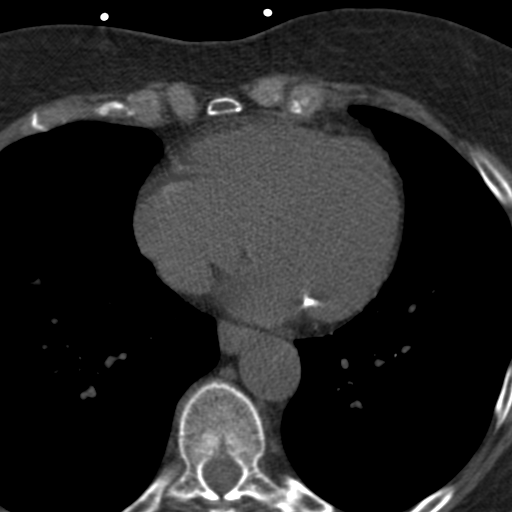
[im 48/91  vessel]
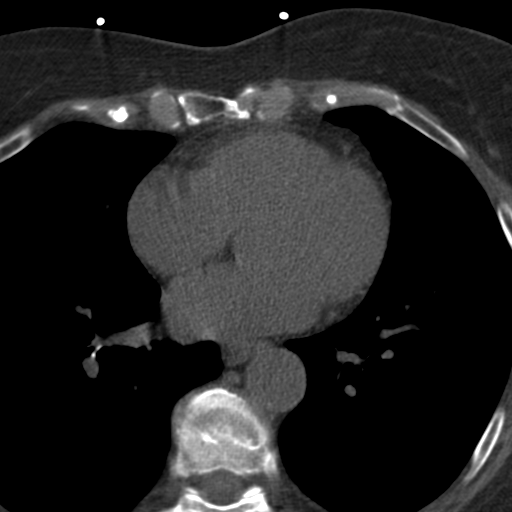
[im 53/91  vessel]
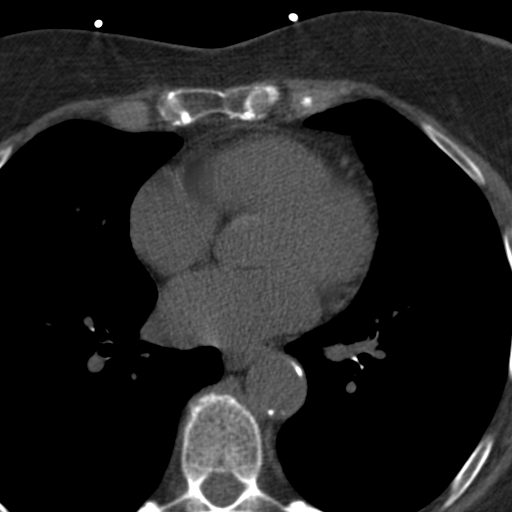
[im 53/91  lung]
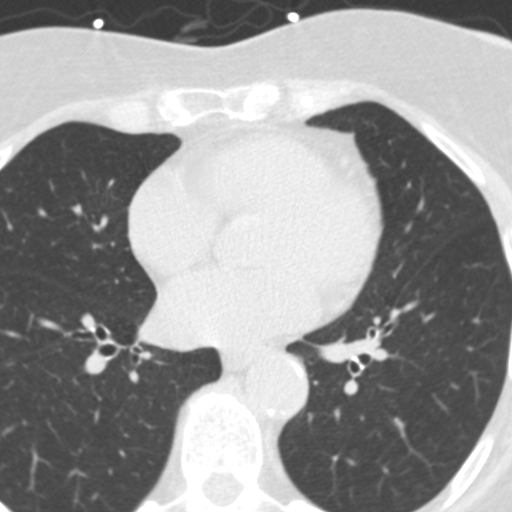
[im 57/91  vessel]
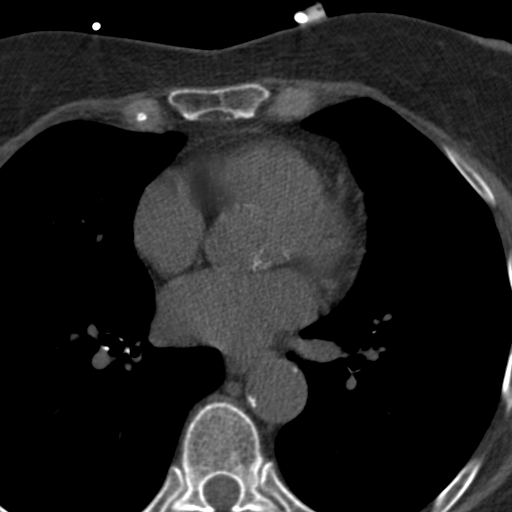
[im 62/91  vessel]
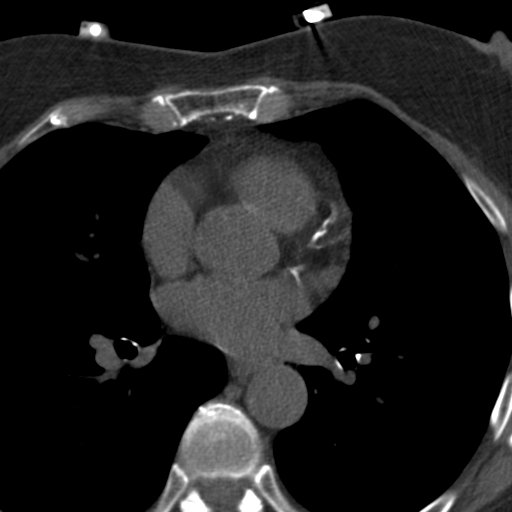
[im 67/91  vessel]
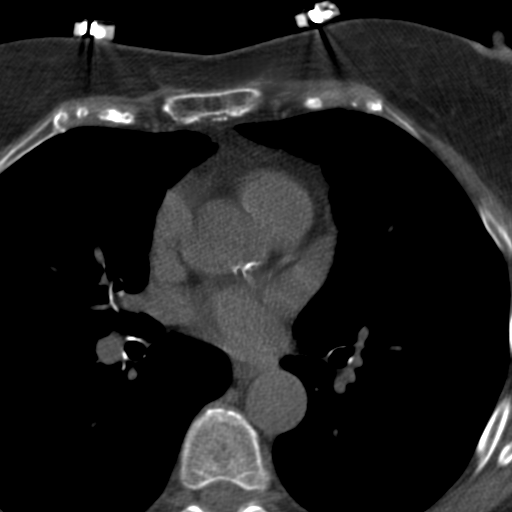
[im 76/91  vessel]
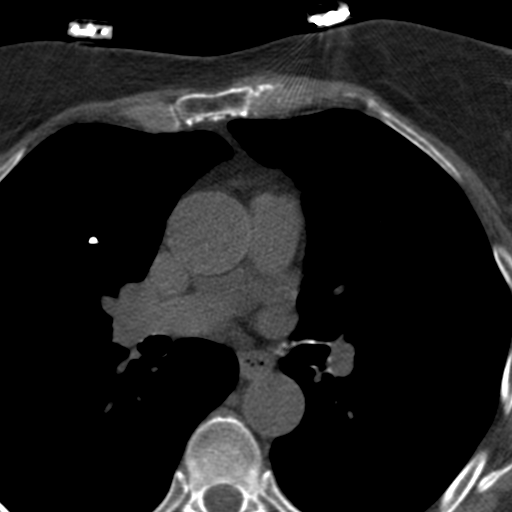
[im 76/91  lung]
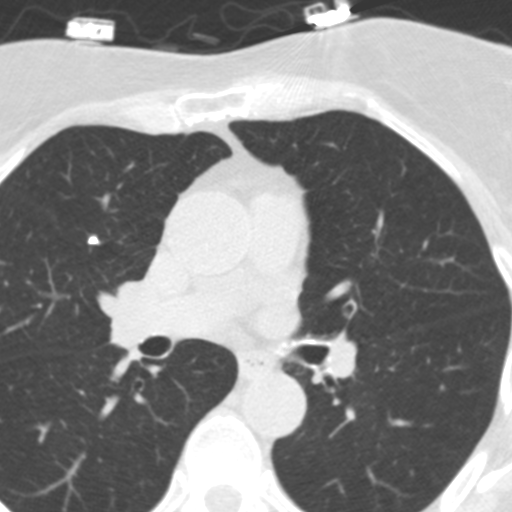
[im 81/91  vessel]
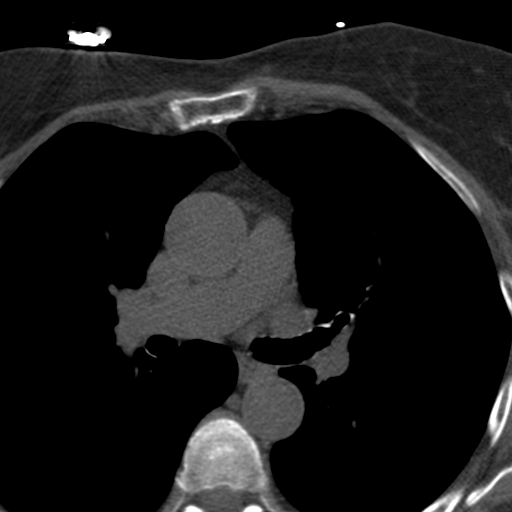
[im 86/91  vessel]
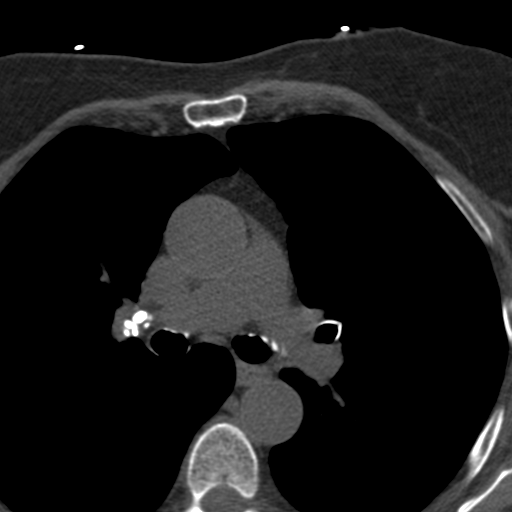

[15 of 20 positions shown; findings below may reference images not displayed]

Count of 
known CT and Cardiac Nuclear Medicine studies performed in the previous 12 
months = 0.
FINDINGS: Calcified granuloma right upper lobe.
IMPRESSION: Left Main (LM) Score: 0 
Left Anterior Descending Artery (LAD) Score: 153 
Circumflex (CM) Score: 40 
Right Coronary Artery (RCA) Score: 0 
Posterior Descending Artery (PDA) Score: 0 
Other: 35 
Total Calcium Score: 28 
Calcified nodes right hilum. 
Consider further evaluation if multi-vessel or left-main predominant disease is 
present. 
Calcium score                                     Presence of Plaque 
0                                                    No evidence of plaque 
1-10                                               Minimal evidence of plaque 
11-100                                            Mild evidence of plaque 
101-400                                          Moderate evidence of plaque 
Over 400                                        Extensive evidence of plaque     

Calcium scoring sheets to follow. 
RADIATION DOSE REDUCTION: All CT scans are performed using radiation dose 
reduction techniques, when applicable.  Technical factors are evaluated and 
adjusted to ensure appropriate moderation of exposure.  Automated dose 
management technology is applied to adjust the radiation doses to minimize 
exposure while achieving diagnostic quality images.

## 2022-12-08 IMAGING — DX THORACIC SPINE 2 VIEWS
3 series · 3 of 3 positions shown · non-contrast
Comparison: None

________________________________________________________________________________________________ 
THORACIC SPINE 2 VIEWS, 12/08/2022 [DATE]: 
CLINICAL INDICATION: Myalgia, Other Site

[AP]
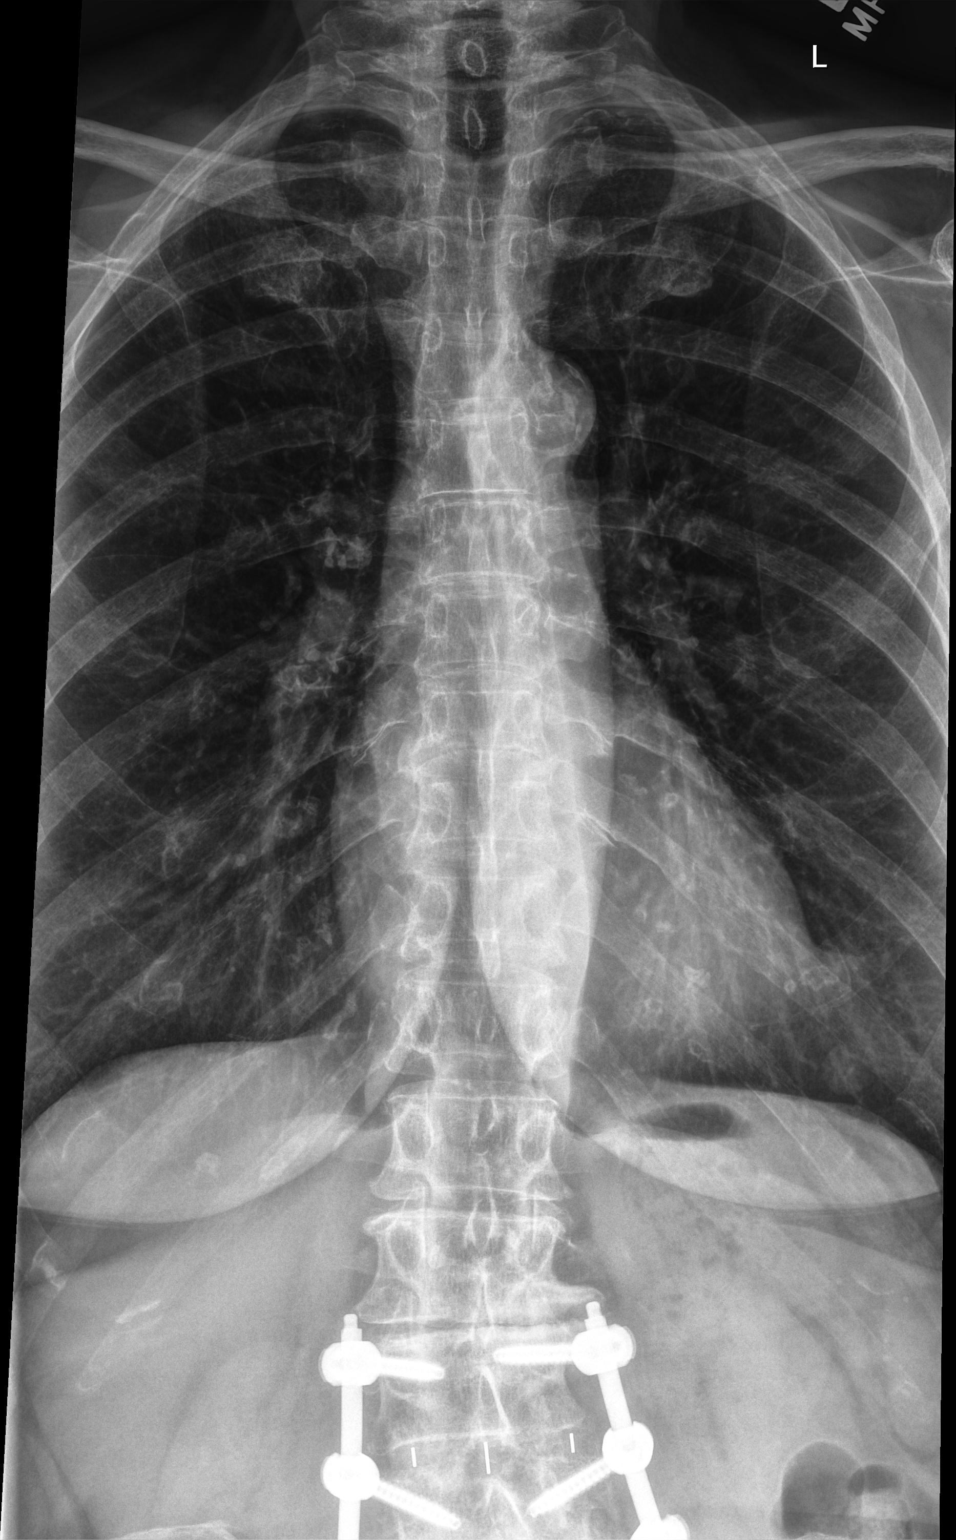

[lateral]
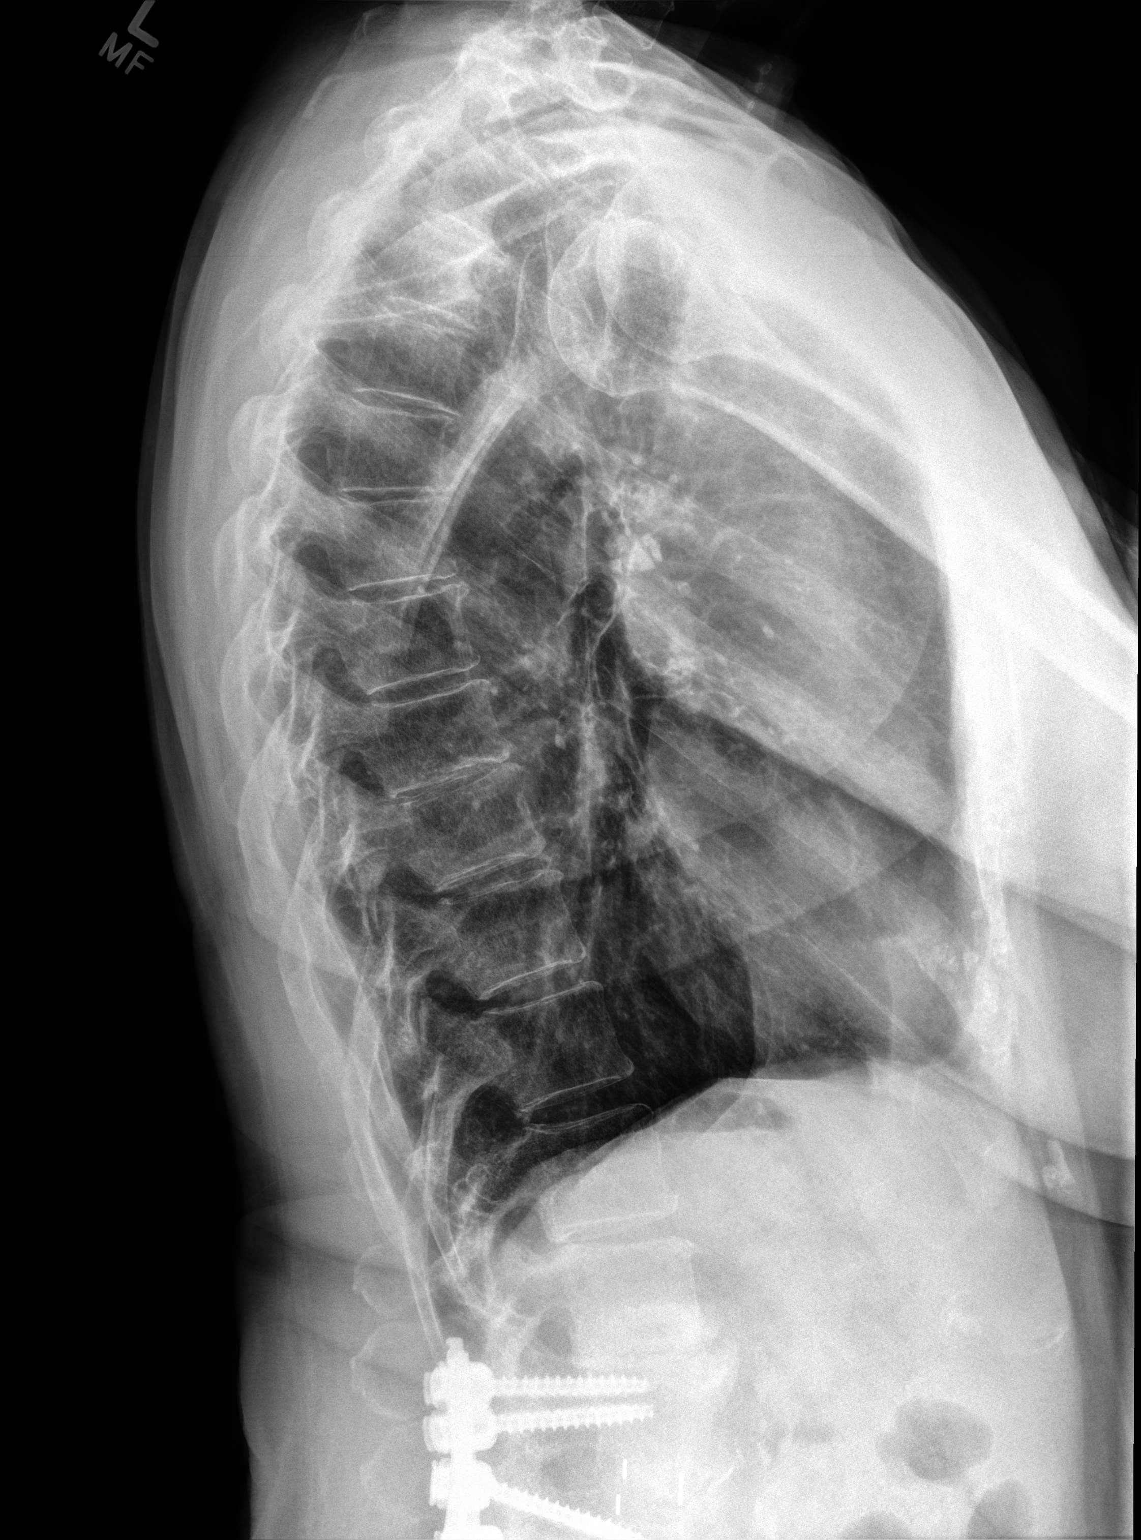

[swimmers]
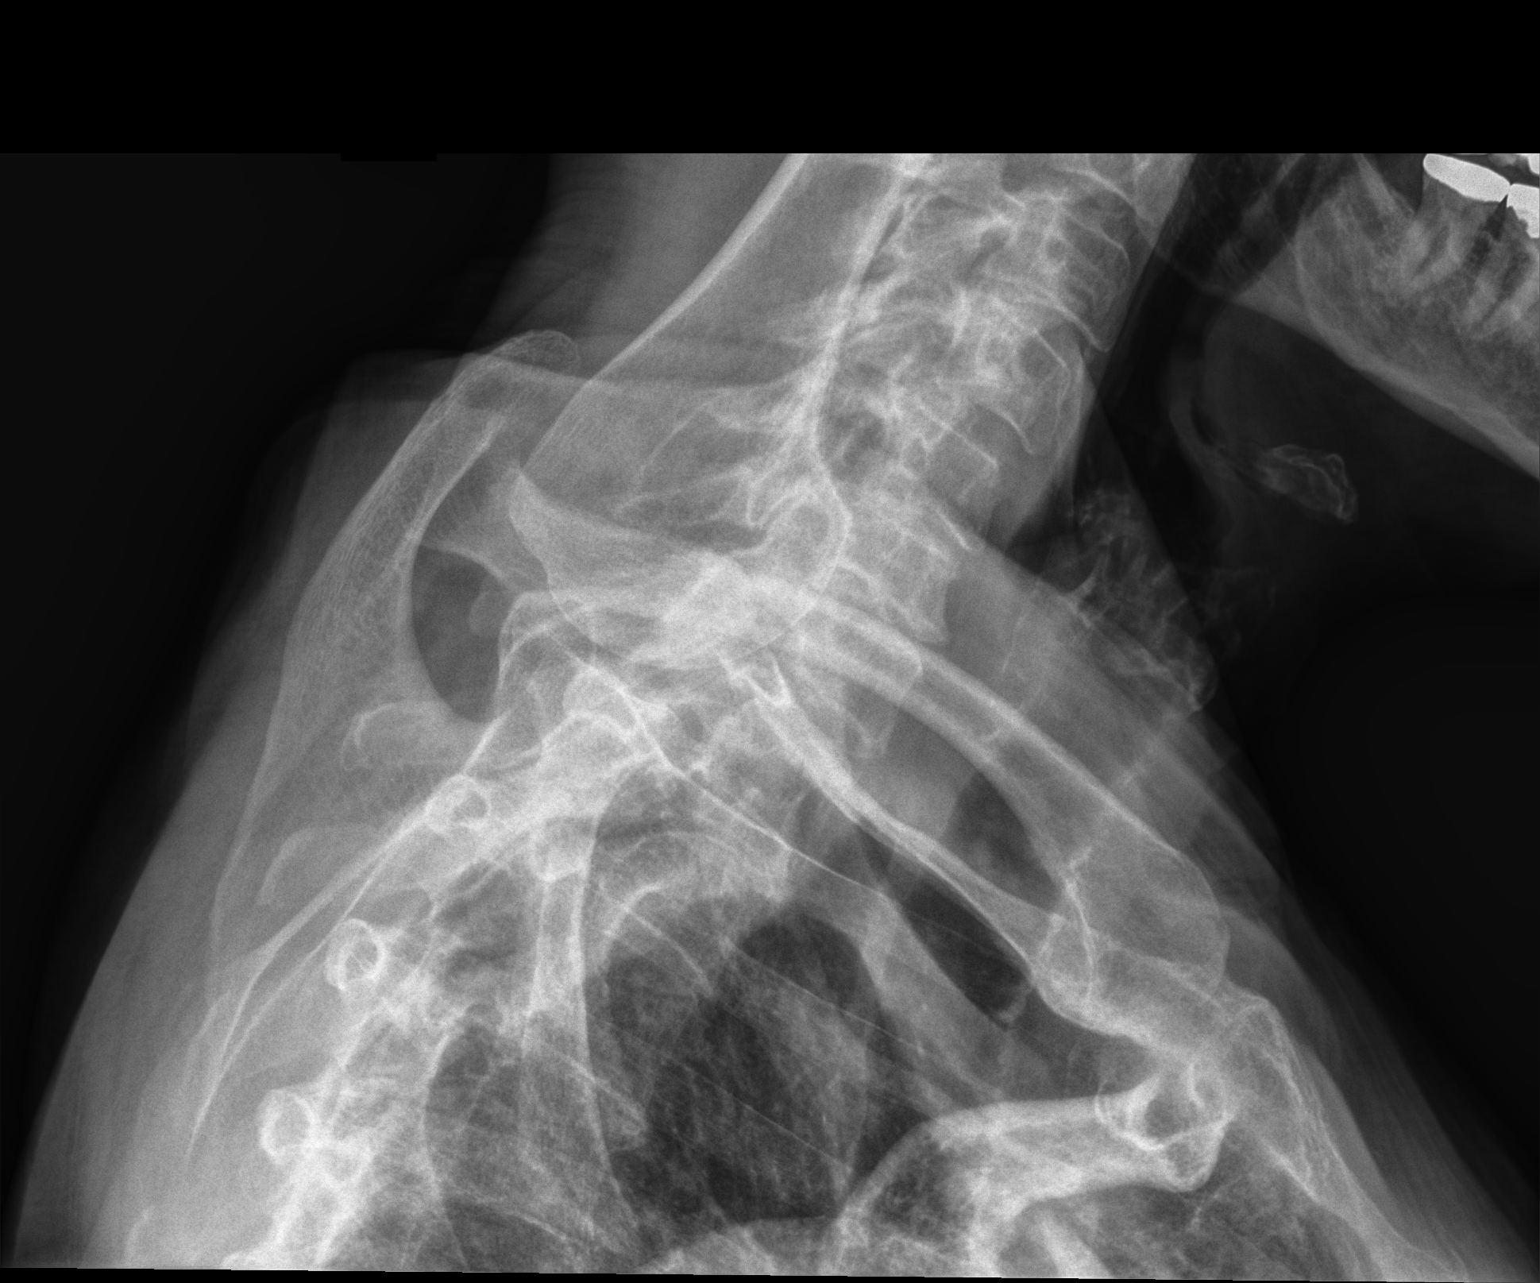

[3 of 3 positions shown; findings below may reference images not displayed]

FINDINGS: No fracture or subluxation. Mild multifocal thoracic disc space 
narrowing and marginal osteophytes. Marked degenerative change at L2-3 and 
partially imaged L3-5 pedicle screw/rod fixation and intervertebral disc spacer. 
Atherosclerosis.
IMPRESSION: Mild degenerative change of the thoracic spine.

## 2023-03-08 IMAGING — MG MAMMOGRAPHY SCREENING BILATERAL 3[PERSON_NAME]
8 series · 8 of 24 positions shown · non-contrast
Comparison: 01/20/2022

________________________________________________________________________________________________ 
MAMMOGRAPHY SCREENING BILATERAL 3PAANO DONOR, 03/08/2023 [DATE]: 
CLINICAL INDICATION: Encounter for screening mammogram.
TECHNIQUE: Digital bilateral mammograms and 3-D Tomosynthesis were obtained. 
These were interpreted both primarily and with the aid of computer-aided 
detection system.  
BREAST DENSITY: (Level B) There are scattered areas of fibroglandular density.

[L MLO]
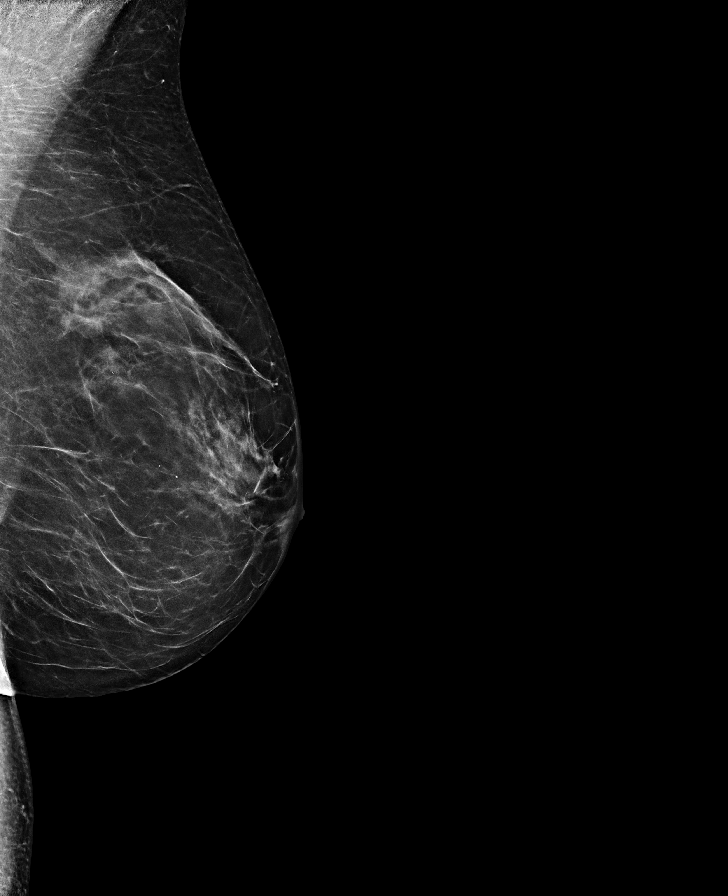

[L CC]
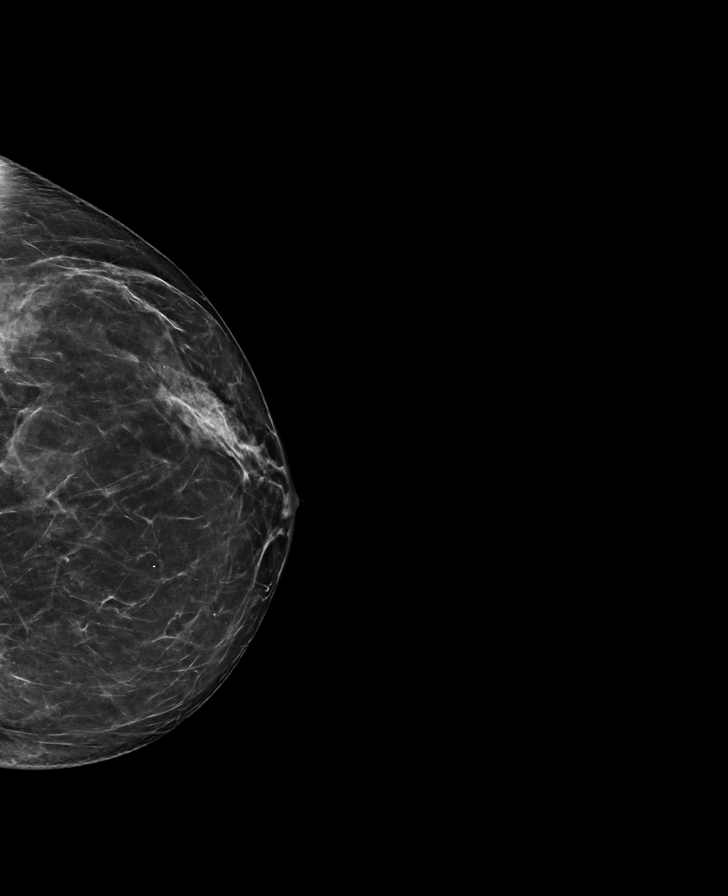

[R MLO]
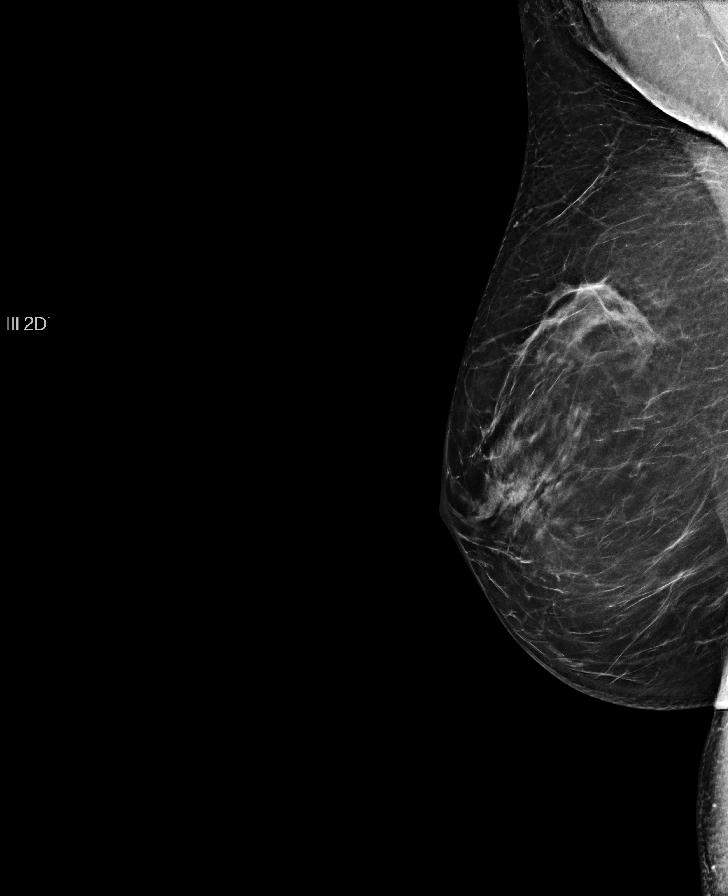

[R CC]
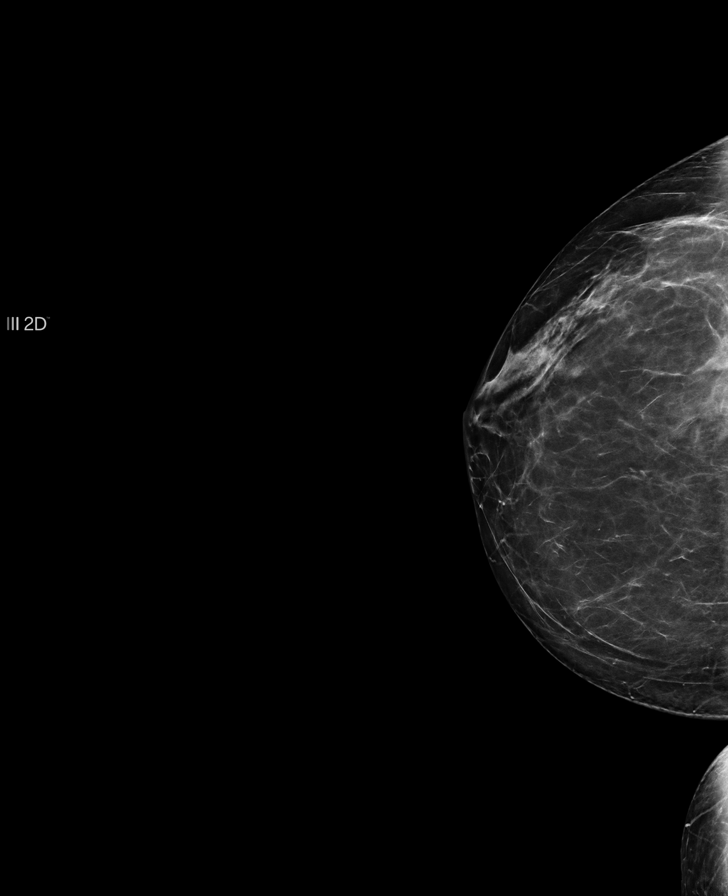

[L MLO tomo · tomo slice 13/25.0]
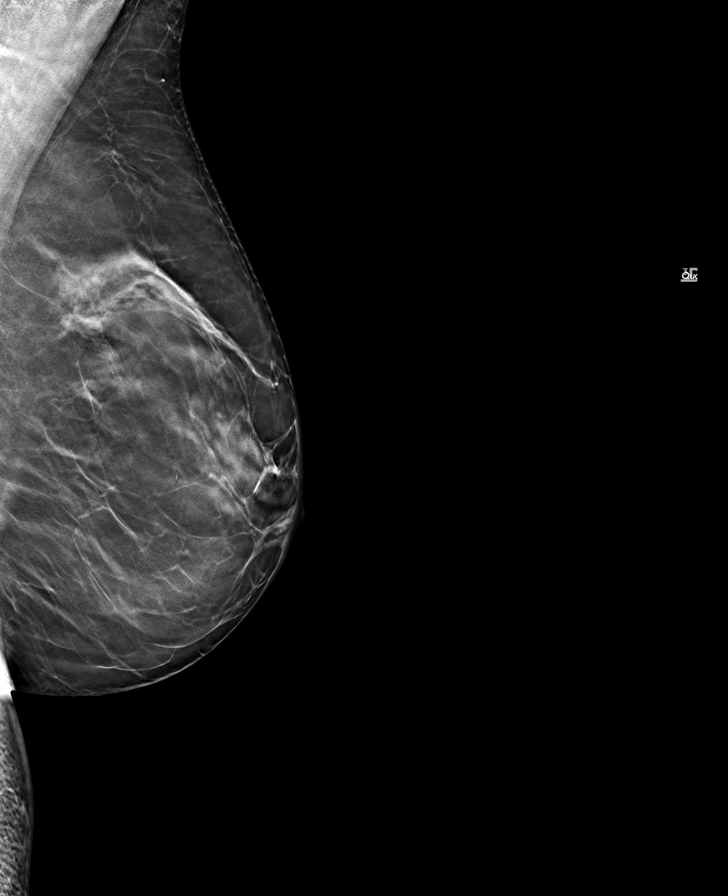

[R CC tomo · tomo slice 11/22.0]
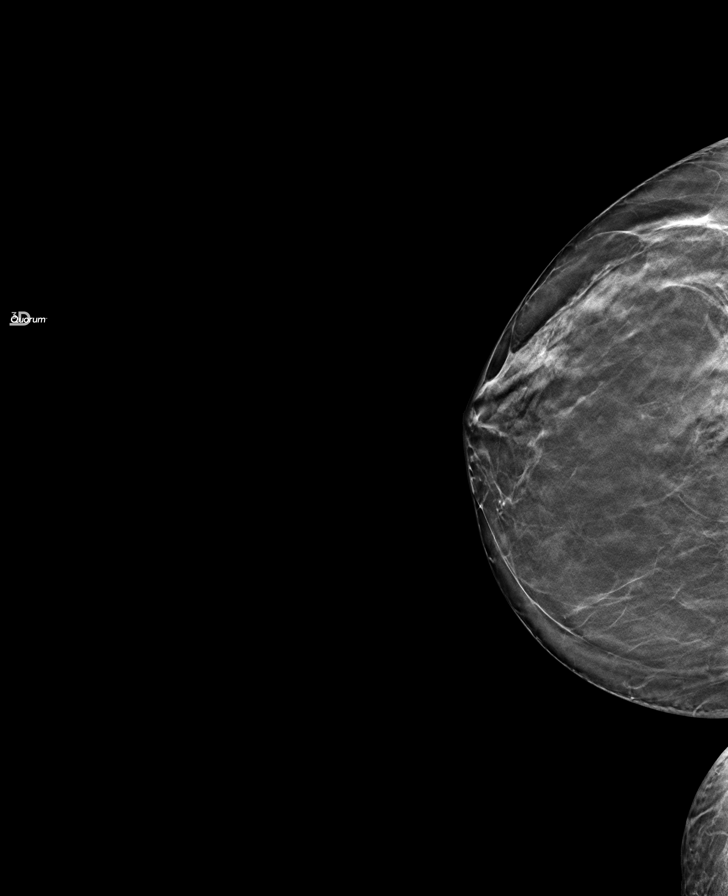

[L CC tomo · tomo slice 11/22.0]
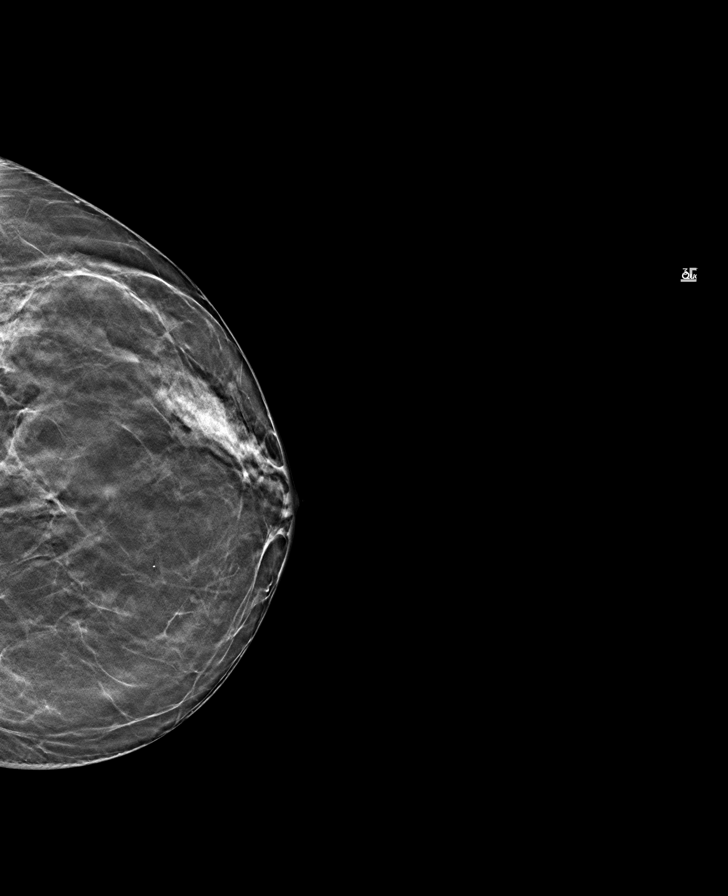

[R MLO tomo · tomo slice 13/24.0]
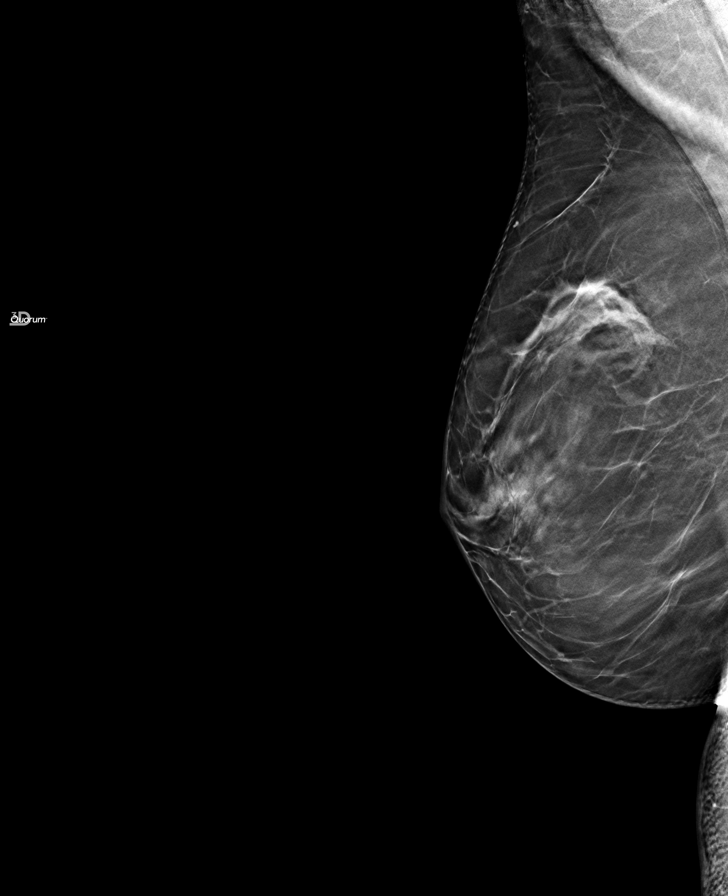

[8 of 24 positions shown; findings below may reference images not displayed]

FINDINGS: No suspicious mass, calcifications, or area of architectural 
distortion in either breast. Overall stable mammographic appearance.
IMPRESSION: No mammographic findings suggestive for malignancy. 
(BI-RADS 2) Benign findings. Routine mammographic follow-up is recommended.

## 2023-04-21 IMAGING — MR MRI CERVICAL SPINE WITHOUT CONTRAST
7 of 12 series · 10 of 48 positions shown · IV contrast (gadolinium)
Comparison: None.

________________________________________________________________________________________________ 
MRI CERVICAL SPINE WITHOUT CONTRAST, 04/21/2023 [DATE]: 
CLINICAL INDICATION: Back pain going into both arms.
TECHNIQUE: Multiplanar, multiecho position MR images of the cervical spine were 
performed without intravenous gadolinium enhancement. Patient was scanned on a 
3T magnet.

[Series 101: survey · axial · 10.0mm · 0.94mm/px · 1 of 9 slices shown]
[im 1/9]
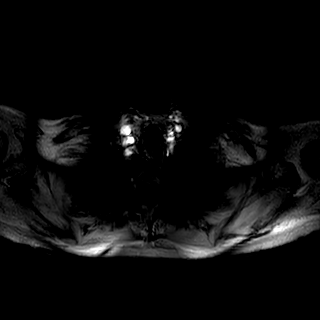

[Series 201: t2w_tse cor · coronal · 5.0mm · 0.52mm/px · 1 of 7 slices shown]
[im 1/7]
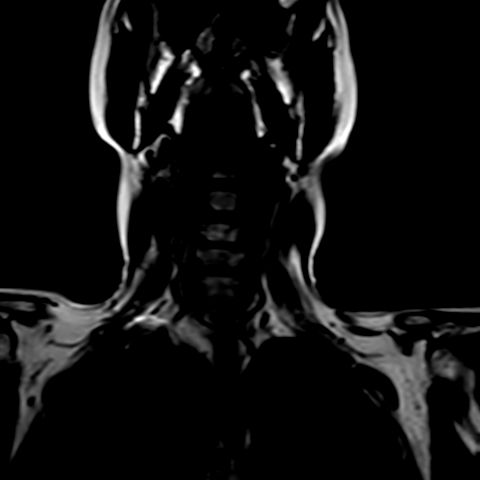

[Series 301: T1 · sagittal · 3.0mm · 0.45mm/px · 1 of 15 slices shown]
[im 1/15]
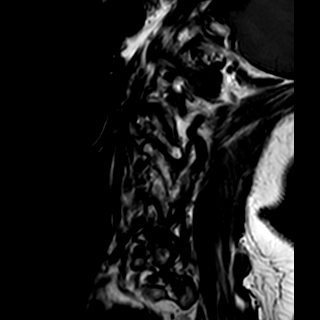

[Series 402: st2w_tse sag fs · sagittal · 3.0mm · 0.38mm/px · 1 of 15 slices shown]
[im 1/15]
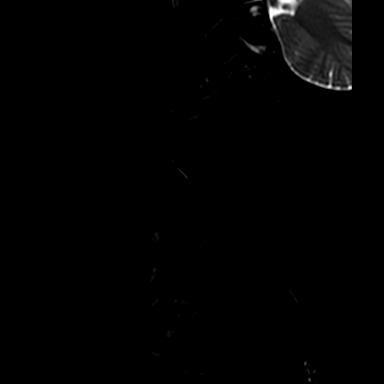

[Series 403: st2w_tse sag · sagittal · 3.0mm · 0.38mm/px · 1 of 15 slices shown]
[im 1/15]
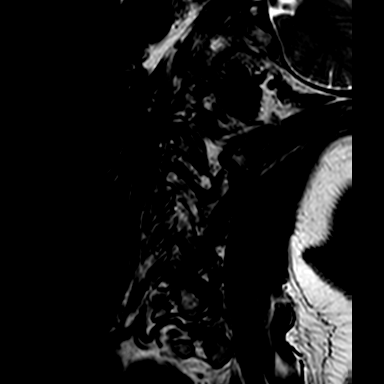

[Series 502: 3dt2 cor/mpr · coronal · 1.0mm · 0.15mm/px · 3 of 76 slices shown]
[im 1/76]
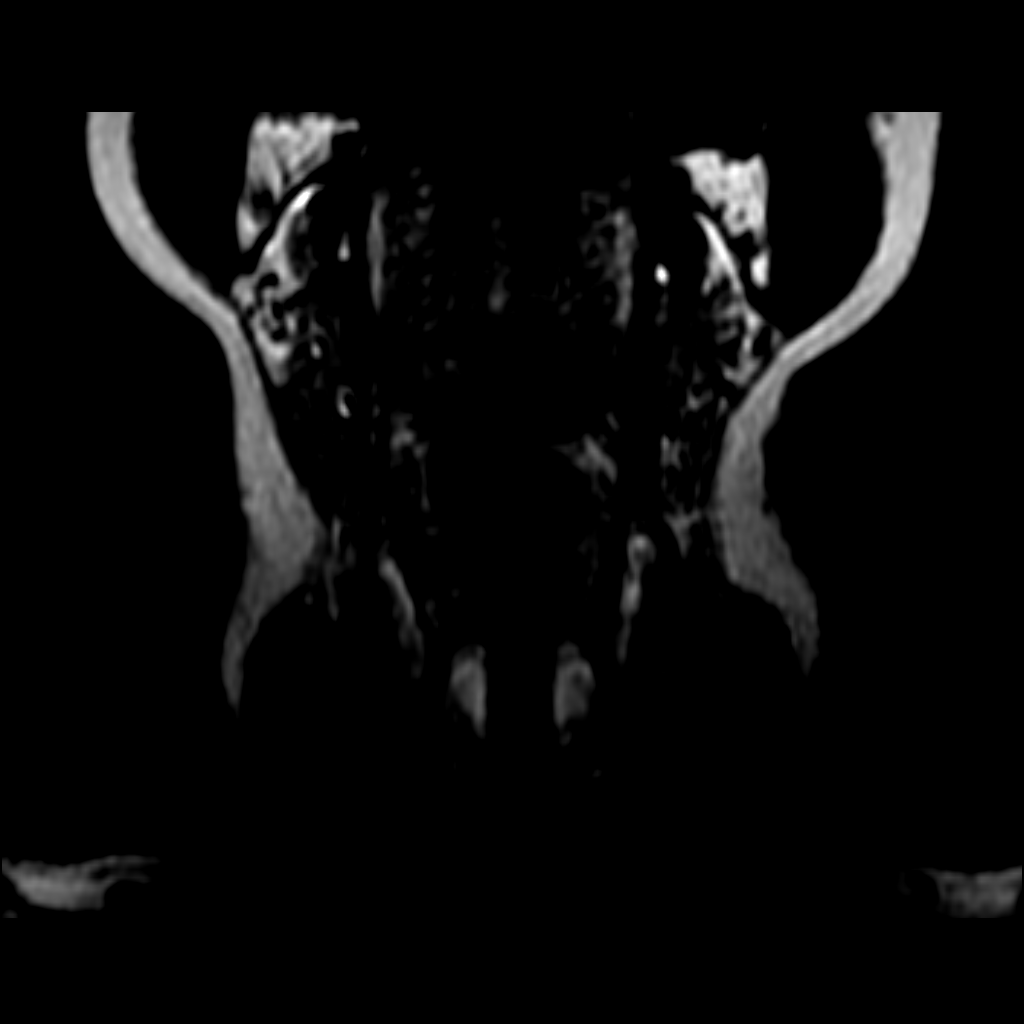
[im 13/76]
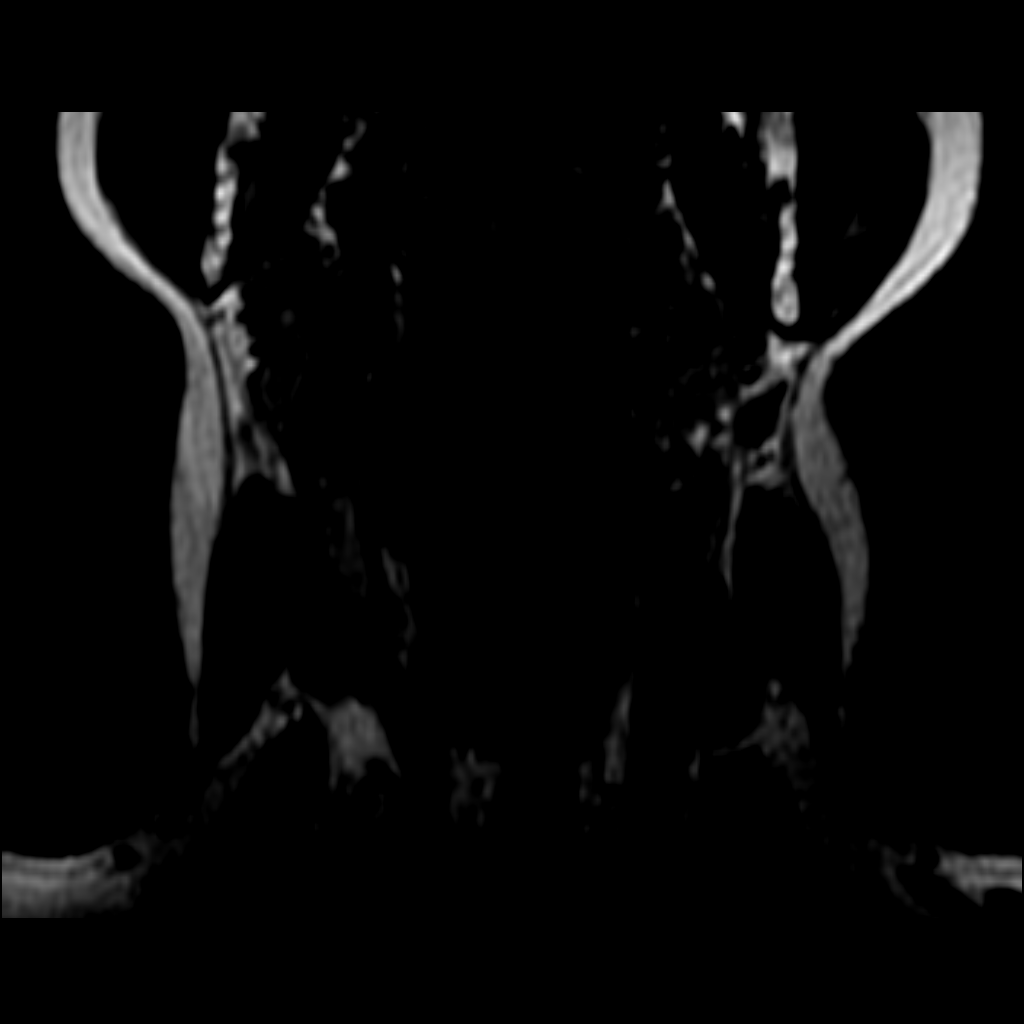
[im 26/76]
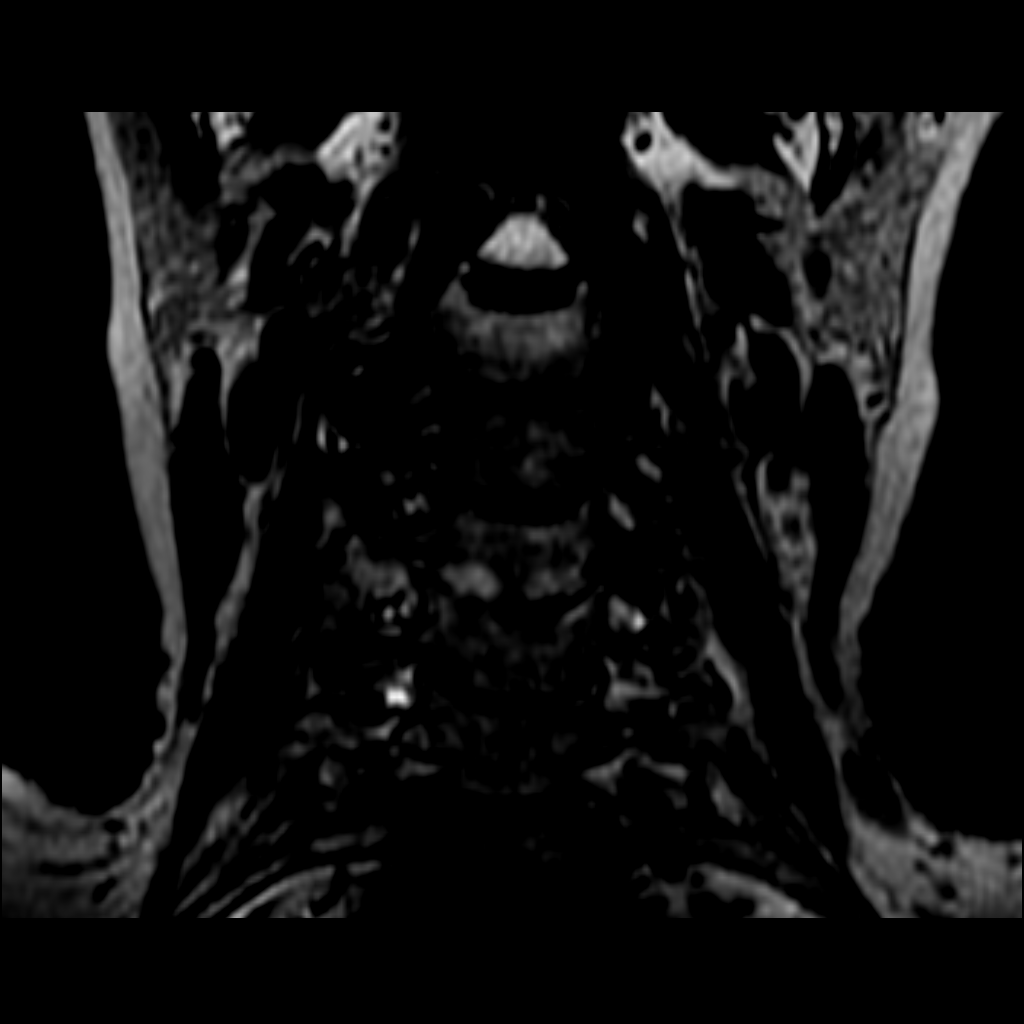

[Series 601: T2 · oblique · 3.0mm · 0.34mm/px · 2 of 19 slices shown]
[im 1/19]
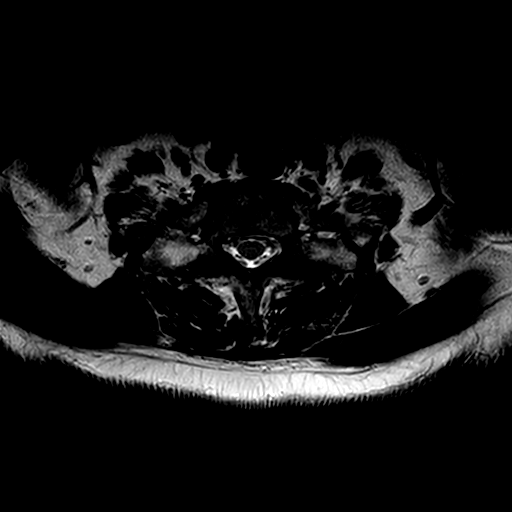
[im 19/19]
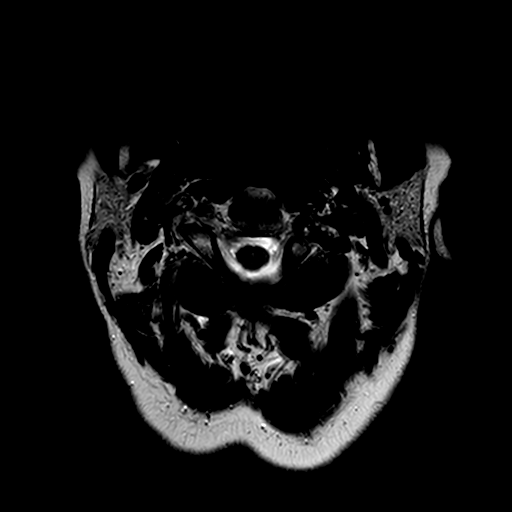

[10 of 48 positions shown; findings below may reference images not displayed]

FINDINGS: -------------------------------------------------------------------------------- 
----------------- 
GENERAL: 
ALIGNMENT: Mild anterolisthesis of C4 on C5, straightening of lower cervical 
lordosis. 
VERTEBRAL BODY HEIGHT: Normal.  
MARROW SIGNAL: T2 prolongation along the right C3-C4 facet joint from active 
degenerative change, T2 prolongation along the left T1-T2 facet joint from 
active degenerative change. Active endplate change at C3-C4 level posteriorly. 
CORD SIGNAL: Normal.  
ADDITIONAL FINDINGS: None. 
-------------------------------------------------------------------------------- 
---------------- 
SEGMENTAL: 
CRANIOCERVICAL JUNCTION: No significant stenosis. 
C2-C3: No significant central canal narrowing.  No significant right neural 
foraminal narrowing. No significant left neural foraminal narrowing.  
C3-C4: Disc osteophyte complex with bilateral uncovertebral joint and 
facet/ligamentum flavum hypertrophy; deformity of the cord with mild central 
canal narrowing.  Moderate right neural foraminal narrowing. Severe left neural 
foraminal narrowing.  
C4-C5: Disc osteophyte complex, bilateral uncovertebral joint and facet 
hypertrophy; deformity of ventral cord with mild central canal narrowing.  No 
significant right neural foraminal narrowing. No significant left neural 
foraminal narrowing.  
C5-C6: Osseous fusion across the posterior margin of the disc space with 
posterior osteophytic ridge, bilateral uncovertebral joint hypertrophy, 
bilateral facet hypertrophy with osseous fusion across facet joints; very mild 
deformity of ventral cord with mild central canal narrowing.  Mild right neural 
foraminal narrowing. No significant left neural foraminal narrowing.  
C6-C7: Disc osteophyte complex, bilateral uncovertebral joint and facet 
hypertrophy; mild central canal narrowing.  Mild right neural foraminal 
narrowing. Mild left neural foraminal narrowing.  
C7-T1: Bilateral facet hypertrophy, right uncovertebral joint hypertrophy; no 
significant central canal narrowing.  Mild right neural foraminal narrowing. 
Mild left neural foraminal narrowing.  
-------------------------------------------------------------------------------- 
---------------
IMPRESSION: 1.  Discogenic/degenerative changes as above. 
2.  Cord signal abnormality: None. 
3.  Cord deformity: C3-C4, C4-C5, C5-C6 
4.  Severe neural foraminal narrowing: C3-C4 (left) 
5.  Active degenerative change at the right C3-C4 facet joint and left T1-T2 
facet joint, which may result in local pain.
